# Patient Record
Sex: Female | Born: 1960 | Hispanic: No | Marital: Married | Smoking: Never smoker
Health system: Southern US, Community
[De-identification: ages and names within clinical notes are randomized; demographics above are authoritative.]

## PROBLEM LIST (undated history)

## (undated) DIAGNOSIS — E119 Type 2 diabetes mellitus without complications: Secondary | ICD-10-CM

## (undated) DIAGNOSIS — E785 Hyperlipidemia, unspecified: Secondary | ICD-10-CM

## (undated) DIAGNOSIS — E669 Obesity, unspecified: Secondary | ICD-10-CM

## (undated) DIAGNOSIS — M7500 Adhesive capsulitis of unspecified shoulder: Secondary | ICD-10-CM

## (undated) DIAGNOSIS — M797 Fibromyalgia: Secondary | ICD-10-CM

## (undated) DIAGNOSIS — I8390 Asymptomatic varicose veins of unspecified lower extremity: Secondary | ICD-10-CM

## (undated) DIAGNOSIS — K279 Peptic ulcer, site unspecified, unspecified as acute or chronic, without hemorrhage or perforation: Secondary | ICD-10-CM

## (undated) HISTORY — DX: Fibromyalgia: M79.7

## (undated) HISTORY — DX: Obesity, unspecified: E66.9

## (undated) HISTORY — DX: Asymptomatic varicose veins of unspecified lower extremity: I83.90

## (undated) HISTORY — DX: Type 2 diabetes mellitus without complications: E11.9

## (undated) HISTORY — PX: NECK SURGERY: SHX720

## (undated) HISTORY — DX: Adhesive capsulitis of unspecified shoulder: M75.00

## (undated) HISTORY — DX: Peptic ulcer, site unspecified, unspecified as acute or chronic, without hemorrhage or perforation: K27.9

## (undated) HISTORY — DX: Hyperlipidemia, unspecified: E78.5

---

## 2000-12-24 ENCOUNTER — Encounter: Payer: Self-pay | Admitting: Family Medicine

## 2000-12-24 ENCOUNTER — Ambulatory Visit (HOSPITAL_COMMUNITY): Admission: RE | Admit: 2000-12-24 | Discharge: 2000-12-24 | Payer: Self-pay | Admitting: Family Medicine

## 2001-02-23 ENCOUNTER — Ambulatory Visit (HOSPITAL_COMMUNITY): Admission: RE | Admit: 2001-02-23 | Discharge: 2001-02-24 | Payer: Self-pay | Admitting: Neurosurgery

## 2001-02-23 ENCOUNTER — Encounter: Payer: Self-pay | Admitting: Neurosurgery

## 2001-08-06 ENCOUNTER — Encounter: Admission: RE | Admit: 2001-08-06 | Discharge: 2001-08-06 | Payer: Self-pay | Admitting: Neurosurgery

## 2001-08-06 ENCOUNTER — Encounter: Payer: Self-pay | Admitting: Neurosurgery

## 2002-11-15 ENCOUNTER — Ambulatory Visit (HOSPITAL_COMMUNITY): Admission: RE | Admit: 2002-11-15 | Discharge: 2002-11-15 | Payer: Self-pay | Admitting: Neurology

## 2002-11-15 ENCOUNTER — Encounter: Payer: Self-pay | Admitting: Neurology

## 2002-11-25 ENCOUNTER — Encounter: Admission: RE | Admit: 2002-11-25 | Discharge: 2003-02-23 | Payer: Self-pay | Admitting: Neurology

## 2003-08-30 ENCOUNTER — Ambulatory Visit (HOSPITAL_COMMUNITY): Admission: RE | Admit: 2003-08-30 | Discharge: 2003-08-30 | Payer: Self-pay | Admitting: Neurosurgery

## 2003-09-05 ENCOUNTER — Ambulatory Visit (HOSPITAL_COMMUNITY): Admission: RE | Admit: 2003-09-05 | Discharge: 2003-09-06 | Payer: Self-pay | Admitting: Neurosurgery

## 2003-10-09 ENCOUNTER — Encounter: Admission: RE | Admit: 2003-10-09 | Discharge: 2003-10-09 | Payer: Self-pay | Admitting: Neurological Surgery

## 2003-11-13 ENCOUNTER — Encounter: Admission: RE | Admit: 2003-11-13 | Discharge: 2003-11-13 | Payer: Self-pay | Admitting: Neurosurgery

## 2004-01-16 ENCOUNTER — Ambulatory Visit: Payer: Self-pay | Admitting: Obstetrics and Gynecology

## 2004-04-09 ENCOUNTER — Ambulatory Visit (HOSPITAL_COMMUNITY): Admission: RE | Admit: 2004-04-09 | Discharge: 2004-04-09 | Payer: Self-pay | Admitting: Neurosurgery

## 2004-04-12 ENCOUNTER — Ambulatory Visit: Payer: Self-pay | Admitting: Family Medicine

## 2004-04-22 ENCOUNTER — Ambulatory Visit: Payer: Self-pay | Admitting: Unknown Physician Specialty

## 2004-04-28 ENCOUNTER — Ambulatory Visit: Payer: Self-pay | Admitting: General Surgery

## 2004-05-03 ENCOUNTER — Ambulatory Visit: Payer: Self-pay | Admitting: Unknown Physician Specialty

## 2004-05-05 ENCOUNTER — Ambulatory Visit: Payer: Self-pay | Admitting: General Surgery

## 2005-05-17 ENCOUNTER — Ambulatory Visit: Payer: Self-pay | Admitting: Obstetrics and Gynecology

## 2006-08-14 ENCOUNTER — Ambulatory Visit: Payer: Self-pay | Admitting: Neurosurgery

## 2007-02-19 ENCOUNTER — Ambulatory Visit: Payer: Self-pay | Admitting: General Surgery

## 2007-04-23 ENCOUNTER — Ambulatory Visit: Payer: Self-pay | Admitting: General Surgery

## 2007-05-31 ENCOUNTER — Ambulatory Visit: Payer: Self-pay | Admitting: Neurosurgery

## 2008-01-06 ENCOUNTER — Encounter: Admission: RE | Admit: 2008-01-06 | Discharge: 2008-01-06 | Payer: Self-pay | Admitting: Rheumatology

## 2008-03-01 ENCOUNTER — Encounter: Admission: RE | Admit: 2008-03-01 | Discharge: 2008-03-01 | Payer: Self-pay | Admitting: Rheumatology

## 2008-05-02 ENCOUNTER — Ambulatory Visit: Payer: Self-pay | Admitting: General Surgery

## 2008-05-02 LAB — HM COLONOSCOPY: HM Colonoscopy: NORMAL

## 2008-05-08 ENCOUNTER — Ambulatory Visit (HOSPITAL_COMMUNITY): Admission: RE | Admit: 2008-05-08 | Discharge: 2008-05-08 | Payer: Self-pay | Admitting: Rheumatology

## 2009-11-19 ENCOUNTER — Ambulatory Visit: Payer: Self-pay | Admitting: Surgery

## 2009-12-08 ENCOUNTER — Ambulatory Visit: Payer: Self-pay | Admitting: Vascular Surgery

## 2010-01-05 ENCOUNTER — Ambulatory Visit: Payer: Self-pay | Admitting: Family Medicine

## 2010-03-16 ENCOUNTER — Ambulatory Visit: Admit: 2010-03-16 | Payer: Self-pay | Admitting: Vascular Surgery

## 2010-03-28 ENCOUNTER — Encounter: Payer: Self-pay | Admitting: Rheumatology

## 2010-07-20 NOTE — Consult Note (Signed)
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  She has been  having increasing swelling in the left leg and somewhat in the right leg  over the last several months with aching, throbbing and burning  discomfort which involves mostly the left thigh and calf but also the  right thigh to some degree.  She has no history of thrombophlebitis,  deep vein thrombosis, bleeding ulceration, or other complications but  does have increasing swelling as the day progresses with the left ankle  worse than the right.  She does not wear elastic compression stockings  nor elevate the legs on a regular basis, although when she does elevate  it, it does make the legs feel better.  She takes Celebrex on a daily  basis without relief.  She has never had venous evaluation before.   CHRONIC MEDICAL PROBLEMS:  1. Type 1 diabetes mellitus.  2. Hyperlipidemia.  3. Cervical disk disease, status post surgery.  4. Negative for hypertension, coronary artery disease, COPD, or      stroke.   SOCIAL HISTORY:  She is married.  Works as a Academic librarian.  Does  not use tobacco or alcohol.   FAMILY HISTORY:  Positive for coronary artery disease in mother.  Negative for diabetes and stroke.   REVIEW OF SYSTEMS:  Positive for headaches, arthritis, muscle pain.  Denies any chest pain, dyspnea on exertion, PND, orthopnea, chronic  bronchitis, productive cough, wheezing.  No GI or GU symptoms.  All  other systems are negative for review of systems.   PHYSICAL EXAMINATION:  Blood pressure 114/82, heart rate 75,  respirations 16.  Generally, a well-developed, well-nourished middle-  aged female in no apparent stress.  Alert and oriented x3.  HEENT:  Normal for age.  EOMs intact.   Lungs are clear to auscultation.  No  rhonchi or wheezing.  Cardiovascular:  Regular rhythm.  No murmurs.  Carotid pulses are 3+.  No bruits.  Abdomen:  Obese.  No palpable  masses.  Musculoskeletal:  Free of major deformities.  Neurologic:  Normal.  Skin is free of rashes.  Lower extremity:  There is 3+ femoral  and popliteal, and dorsalis pedis pulses are palpable.  She has 1+ to 2+  edema on the left and 1+ on the right.  Left leg has bulging  varicosities in the great saphenous system beginning in the mid thigh,  extending down to the ankle along the medial calf with some early  hyperpigmentation, 1+ edema, and some scattered reticular veins.  Right  leg has venous insufficiency along the course of the great saphenous  system in the medial thigh and medial calf with some smaller  varicosities in the medial calf area.  No hyperpigmentation.   Today I reviewed the clinical records and also reviewed the venous  duplex exam, which we performed in our lab on September 15th, looking  primarily for deep venous obstruction, and there was no evidence of deep  venous obstruction.   I also did a bedside SonoSite exam today and visualized the left great  saphenous vein at  the saphenofemoral junction, which has gross reflux  extending down to the knee.   I think she does have symptoms coming from valvular incompetence  involving her left great saphenous system with possible early disease on  the right.  We will treat her with long-leg elastic compression  stockings (20 mm -30 mm gradient) as well as elevation and ibuprofen.  She will return in 3 months for further examination.  We will also  obtain a formal venous duplex exam at that time.  If her findings are  corroborated, and she has no improvement, I think we should proceed with  laser ablation of the left great saphenous vein with multiple stab  phlebectomies.  Treatment of the right side will be determined after the  formal  ultrasound.     Quita Skye Hart Rochester, M.James.  Electronically Signed   JDL/MEDQ  James:  12/08/2009  T:  12/09/2009  Job:  [id number]   cc:   Wyline Beady, James.P.M.

## 2010-07-20 NOTE — Procedures (Signed)
DUPLEX DEEP VENOUS EXAM - LOWER EXTREMITY   INDICATION:  Left lower extremity leg pain.   HISTORY:  Edema:  Yes.  Trauma/Surgery:  No.  Pain:  Yes.  PE:  No.  Previous DVT:  No.  Anticoagulants:  No.  Other:   DUPLEX EXAM:                CFV   SFV   PopV  PTV    GSV                R  L  R  L  R  L  R   L  R  L  Thrombosis    o  o     o     o      o     o  Spontaneous   +  +     +     +      +     +  Phasic        +  +     +     +      +     +  Augmentation  +  +     +     +      +     +  Compressible  +  +     +     +      +     +  Competent     o  o     +     +      +     o   Legend:  + - yes  o - no  p - partial  D - decreased   IMPRESSION:  Left lower extremity deep veins appear negative for deep  venous thrombosis.    _____________________________  Quita Skye Hart [place], M.D.   EM/MEDQ  D:  11/19/2009  T:  11/19/2009  Job:  [id number]
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
## 2010-07-23 NOTE — Op Note (Signed)
[place]  Patient:    Carrie James, Carrie James Visit Number: [id number] MRN: [id number]          Service Type: DSU Location: 3000 3040 01 Attending Physician:  Donn Pierini Dictated by:   Julio Sicks, M.D. Proc. 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  PROCEDURE:  C5-6 and C6-7 anterior cervical diskectomy and fusion with allograft and anterior plating.  SURGEON:  Julio Sicks, M.D.  ASSISTANT:  Donalee Citrin, Montez Hageman., M.D.  ANESTHESIA:  General endotracheal.  INDICATION:  Ms. Mcclarty is a 50 year old female with history of neck pain and intermittent left upper extremity symptoms consistent with a left-sided C6 and C7 radiculopathy.  The patient has failed conservative management.  MRI scan demonstrates marked spondylosis at C5-6 and C6-7 with mild to moderate spondylosis at C4-5.  At C5-6 and C6-7 there is moderate spinal stenosis causing spinal cord compression and severe left-sided neural foraminal stenosis.  The patient had been counseled as to her options.  She has decided to proceed with a C5-6 and C6-7 anterior cervical diskectomy and fusion with allograft and anterior plating for hopeful improvement of her symptoms.  We discussed the risks and benefits of surgery, and the patient wishes to proceed.  DESCRIPTION OF PROCEDURE:  The patient was taken to the operating room and placed on the operating table in supine position.  After an adequate level of anesthesia achieved, patient positioned supine with her neck slightly extended and held in place with Holter traction.  The patients anterior cervical region was prepped and draped sterilely.  A 10 blade was used to make a linear skin incision overlying the C6 level.   This was carried down sharply to the platysma.  The platysma was then divided vertically and dissection proceeded along the medial border of the sternocleidomastoid muscle and carotid sheath on the patients right side.  Trachea and esophagus were mobilized and retracted to the left.  Prevertebral fascia was stripped off the anterior spinal column.  Longus colli muscle was then elevated bilaterally using electrocautery.  A deep self-retaining retractor was placed and intraoperative fluoroscopy was used, and the C5-6 and C6-7 levels were identified.  The disk space at both levels was then incised with a 15 blade in rectangular fashion. A wide disk space cleanout was then achieved using pituitary rongeurs, forward and backward-angled Karlin curettes, Kerrison rongeurs, and the high-speed drill.  All elements of the disk were removed at both levels, removed down to the level of the posterior annulus.  The microscope was brought into the field and used throughout the remainder of the diskectomy.  Starting first at C5-6, the remaining aspects of osteophytes and annulus were removed using the high-speed drill down to the level of the posterior longitudinal ligament. The posterior longitudinal ligament was then elevated and resected in a piecemeal fashion using Kerrison rongeurs.  The underlying thecal sac was identified.  A wide central decompression was then performed by undercutting the bodies of C5 and C6 utilizing Kerrison rongeurs.  The underlying spinal canal was then widely decompressed.  Decompression then proceeded out to the left-sided C6 foramen.  The C6 nerve root was identified properly.  All  evidence of spondylitic compression was resected using Kerrison rongeurs, and a wide anterior foraminotomy was performed.  Decompression then proceeded out to the right-sided C6 foramen, and once again the C6 nerve root was identified and a wide anterior foraminotomy was performed.  Attention then  placed at C6-7.  Once again the remaining aspects of osteophytes and annulus were removed using by the high-speed drill down to the level of the posterior longitudinal ligament.  The posterior longitudinal ligament was then elevated and resected in piecemeal fashion using Kerrison rongeurs.  The underlying thecal sac was identified.  A wide central decompression was then performed using Kerrison rongeurs to undercut the bodies of C6 and C7.  Decompression then proceeded out to the left-sided C7 foramen.  The C7 nerve root was identified proximally and a wide anterior foraminotomy was performed using Kerrison rongeurs to fully decompress the nerve root.  Decompression then proceeded out to the right-sided C7 foramen.  Once again the C7 nerve root was identified proximally and a wide anterior foraminotomy was performed using Kerrison rongeurs.  The wound was then copiously irrigated with antibiotic solution.  Gelfoam was placed topically at both levels for hemostasis.  A 6 mm patellar wedge allograft was then impacted into placed and recessed approximately 1 mm from the anterior cortical margin at C5-6 and C6-7.  A 35 mm Atlantis anterior cervical plate was then placed over the C5, C6, and C7 levels.  It was then attached under fluoroscopic guidance using two 13 mm variable-angled screws at C5, C6, and C7.  All six screws were found to be solidly within bone and given a final tightening.  Locking screws were engaged at all three levels.  Retractor was removed.  Hemostasis achieved with bipolar electrocautery.  Final images revealed good position of bone grafts and hardware at the proper operative level, with normal alignment of the spine. The wound was inspected for hemostasis and found to be good.  It was irrigated one final time.  It was then closed in typical fashion.  Steri-Strips and sterile dressing were applied.  There were no apparent complications.  The patient tolerated the  procedure well, and she returns to the recovery room postoperatively. Dictated by:   Julio Sicks, M.D. Attending Physician:  Donn Pierini DD:  02/23/01  TD:  02/26/01 Job: [id number]

## 2013-11-12 ENCOUNTER — Ambulatory Visit: Payer: Self-pay | Admitting: Family Medicine

## 2013-11-12 LAB — HM MAMMOGRAPHY

## 2013-12-09 LAB — HM PAP SMEAR: HM PAP: NEGATIVE

## 2014-05-19 ENCOUNTER — Telehealth: Payer: Self-pay | Admitting: *Deleted

## 2014-05-19 NOTE — Telephone Encounter (Signed)
Patient was calling wanting to know if Dr. Anne HahnWillis could fill out a disability form. Patient was last seen in 2012 which was 4 years ago. Explained to the patient that she would need to get reestablished as a patient before anything can be done. Patient verbalizes understanding. She is aware of a new referral as well.

## 2014-05-26 ENCOUNTER — Encounter: Payer: Self-pay | Admitting: Neurology

## 2014-05-26 ENCOUNTER — Telehealth: Payer: Self-pay | Admitting: *Deleted

## 2014-05-26 ENCOUNTER — Ambulatory Visit (INDEPENDENT_AMBULATORY_CARE_PROVIDER_SITE_OTHER): Payer: Managed Care, Other (non HMO) | Admitting: Neurology

## 2014-05-26 VITALS — BP 120/65 | HR 63 | Ht 62.0 in | Wt 169.6 lb

## 2014-05-26 DIAGNOSIS — R519 Headache, unspecified: Secondary | ICD-10-CM | POA: Insufficient documentation

## 2014-05-26 DIAGNOSIS — M7502 Adhesive capsulitis of left shoulder: Secondary | ICD-10-CM | POA: Diagnosis not present

## 2014-05-26 DIAGNOSIS — M797 Fibromyalgia: Secondary | ICD-10-CM

## 2014-05-26 DIAGNOSIS — G441 Vascular headache, not elsewhere classified: Secondary | ICD-10-CM | POA: Diagnosis not present

## 2014-05-26 DIAGNOSIS — M7500 Adhesive capsulitis of unspecified shoulder: Secondary | ICD-10-CM | POA: Insufficient documentation

## 2014-05-26 DIAGNOSIS — R51 Headache: Secondary | ICD-10-CM

## 2014-05-26 HISTORY — DX: Adhesive capsulitis of unspecified shoulder: M75.00

## 2014-05-26 MED ORDER — MELOXICAM 15 MG PO TABS: 15.0000 mg | ORAL_TABLET | Freq: Every day | ORAL | Status: DC

## 2014-05-26 NOTE — Progress Notes (Signed)
Reason for visit: Left shoulder pain  Referring physician: Dr. Van Clines  Carrie James is a 54 y.o. female  History of present illness:  Carrie James is a 54 year old right-handed [place] female with a history of fibromyalgia. The patient has been seen through this office previously, lasting on 09/01/2010. The patient is on no medications for her fibromyalgia, she is managing the chronic pain with stretching exercises and mild physical activity. The patient will walk about 7 miles a day. She stretches out her neck and shoulders regularly. Within the last month, she has developed a different type of pain in the left shoulder. The pain is associated with restriction of movement of the left arm, she is unable to lift the arm above her head. She has sharp jabbing pains at times. She indicates that she did get a steroid injection in the shoulder which seemed to help the mobility some, but the pain continues. She has had plain x-rays of the shoulder and she was told were unremarkable. The patient did have discomfort across the shoulders, into the back of the head, with occasional cervicogenic headache. The patient has back pain as well. She has a prior history of cervical spine surgery. She does have some occasional numbness and tingling in the ulnar aspect of the left hand, and she had some arthritic pain in the PIP joints of the left hand and saddle joint of the left thumb. She comes to this office for further evaluation.  Past Medical History  Diagnosis Date  . Diabetes mellitus without complication   . Hyperlipidemia   . Fibromyalgia   . Obesity   . 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 Varicose vein of leg   . PUD (peptic ulcer disease)     Past Surgical History  Procedure Laterality Date  . Neck surgery  2002, 2005    fusion    Family History  Problem Relation Age of Onset  . Heart attack Mother   . Healthy Father   . Healthy Sister   . Healthy Brother   . Healthy Brother       Social history:  reports that she has never smoked. She has never used smokeless tobacco. She reports that she does not drink alcohol or use illicit drugs.  Medications:  Prior to Admission medications   Medication Sig Start Date End Date Taking? Authorizing Provider  atorvastatin (LIPITOR) 20 MG tablet Take 20 mg by mouth daily. 01/09/14  Yes Historical Provider, MD  Cholecalciferol 1000 UNITS tablet Take 2,000 Units by mouth daily.   Yes Historical Provider, MD  insulin aspart (NOVOLOG) 100 UNIT/ML injection Inject 15 Units into the skin daily.   Yes Historical Provider, MD  insulin detemir (LEVEMIR) 100 UNIT/ML injection Inject 54 Units into the skin daily.   Yes Historical Provider, MD  sitaGLIPtin (JANUVIA) 25 MG tablet Take 25 mg by mouth daily.   Yes Historical Provider, MD  Vitamin D, Ergocalciferol, (DRISDOL) 50000 UNITS CAPS capsule Take 5,000 Units by mouth once a week. 01/09/14  Yes Historical Provider, MD     No Known Allergies  ROS:  Out of a complete 14 system review of symptoms, the patient complains only of the following symptoms, and all other reviewed systems are negative.  Swelling in the legs Ringing in the ears Joint pain, joint swelling, aching muscles Headache, numbness   Blood pressure 120/65, pulse 63, height  (1.575 m), weight 169 lb 9.6 oz (76.93 kg).  Physical Exam  General: The patient is alert and cooperative at the time of the examination.  Eyes: Pupils are equal, round, and reactive to light. Discs are flat bilaterally.  Neck: The neck is supple, no carotid bruits are noted.  Respiratory: The respiratory examination is clear.  Cardiovascular: The cardiovascular examination reveals a regular rate and rhythm, no obvious murmurs or rubs are noted.  Neuromuscular: The patient has restriction of abduction, internal and external rotation of the left shoulder, not present on the right.  Skin: Extremities are without significant  edema.  Neurologic Exam  Mental status: The patient is alert and oriented x 3 at the time of the examination. The patient has apparent normal recent and remote memory, with an apparently normal attention span and concentration ability.  Cranial nerves: Facial symmetry is present. There is good sensation of the face to pinprick and soft touch bilaterally. The strength of the facial muscles and the muscles to head turning and shoulder shrug are normal bilaterally. Speech is well enunciated, no aphasia or dysarthria is noted. Extraocular movements are full. Visual fields are full. The tongue is midline, and the patient has symmetric elevation of the soft palate. No obvious hearing deficits are noted.  Motor: The motor testing reveals 5 over 5 strength of all 4 extremities. Good symmetric motor tone is noted throughout.  Sensory: Sensory testing is intact to pinprick, soft touch, vibration sensation, and position sense on all 4 extremities. No evidence of extinction is noted.  Coordination: Cerebellar testing reveals good finger-nose-finger and heel-to-shin bilaterally.  Gait and station: Gait is normal. Tandem gait is normal. Romberg is negative. No drift is seen.  Reflexes: Deep tendon reflexes are symmetric and normal bilaterally. Toes are downgoing bilaterally.   Assessment/Plan:  1. Fibromyalgia  2. Frozen shoulder, left  3. Cervicogenic headache  The patient is having discomfort involving the left shoulder with restriction of movement across the left shoulder joint. The patient appears to have partial frozen shoulder on the left. The patient has fibromyalgia pain, and cervicogenic headache. She will be placed on Mobic for at least 6 weeks, taking 15 mg daily with food. The patient will be sent for physical therapy for range of motion therapy of the left shoulder. If this is not helpful, she will be referred to orthopedic surgery for further management of the left shoulder  issues.    Marlan Palau. Keith Ahmaud Duthie MD 05/26/2014 7:37 PM  [facility] [address] [place], [place] [id number]  Phone [phone] Fax [phone]

## 2014-05-26 NOTE — Telephone Encounter (Signed)
Form,[employer] [facility] Dr Anne HahnWillis 05/26/14.

## 2014-05-26 NOTE — Patient Instructions (Signed)
Fibromyalgia Fibromyalgia is a disorder that is often misunderstood. It is associated with muscular pains and tenderness that comes and goes. It is often associated with fatigue and sleep disturbances. Though it tends to be long-lasting, fibromyalgia is not life-threatening. CAUSES  The exact cause of fibromyalgia is unknown. People with certain gene types are predisposed to developing fibromyalgia and other conditions. Certain factors can play a role as triggers, such as:  Spine disorders.  Arthritis.  Severe injury (trauma) and other physical stressors.  Emotional stressors. SYMPTOMS   The main symptom is pain and stiffness in the muscles and joints, which can vary over time.  Sleep and fatigue problems. Other related symptoms may include:  Bowel and bladder problems.  Headaches.  Visual problems.  Problems with odors and noises.  Depression or mood changes.  Painful periods (dysmenorrhea).  Dryness of the skin or eyes. DIAGNOSIS  There are no specific tests for diagnosing fibromyalgia. Patients can be diagnosed accurately from the specific symptoms they have. The diagnosis is made by determining that nothing else is causing the problems. TREATMENT  There is no cure. Management includes medicines and an active, healthy lifestyle. The goal is to enhance physical fitness, decrease pain, and improve sleep. HOME CARE INSTRUCTIONS   Only take over-the-counter or prescription medicines as directed by your caregiver. Sleeping pills, tranquilizers, and pain medicines may make your problems worse.  Low-impact aerobic exercise is very important and advised for treatment. At first, it may seem to make pain worse. Gradually increasing your tolerance will overcome this feeling.  Learning relaxation techniques and how to control stress will help you. Biofeedback, visual imagery, hypnosis, muscle relaxation, yoga, and meditation are all options.  Anti-inflammatory medicines and  physical therapy may provide short-term help.  Acupuncture or massage treatments may help.  Take muscle relaxant medicines as suggested by your caregiver.  Avoid stressful situations.  Plan a healthy lifestyle. This includes your diet, sleep, rest, exercise, and friends.  Find and practice a hobby you enjoy.  Join a fibromyalgia support group for interaction, ideas, and sharing advice. This may be helpful. SEEK MEDICAL CARE IF:  You are not having good results or improvement from your treatment. FOR MORE INFORMATION  National Fibromyalgia Association: www.fmaware.org Arthritis Foundation: www.arthritis.org Document Released: 02/21/2005 Document Revised: 05/16/2011 Document Reviewed: 06/03/2009 ExitCare Patient Information 2015 [employer]. This information is not intended to replace advice given to you by your health care provider. Make sure you discuss any questions you have with your health care provider.  

## 2014-05-26 NOTE — Telephone Encounter (Signed)
Form given to Dr. Anne HahnWillis.

## 2014-05-27 ENCOUNTER — Telehealth: Payer: Self-pay | Admitting: *Deleted

## 2014-05-27 DIAGNOSIS — Z0289 Encounter for other administrative examinations: Secondary | ICD-10-CM

## 2014-05-27 NOTE — Telephone Encounter (Signed)
Form,[employer] [employer] received,completed by Dr Anne HahnWillis and [name] at front desk for patient 05-26-24.

## 2014-05-28 ENCOUNTER — Telehealth: Payer: Self-pay | Admitting: Neurology

## 2014-05-28 NOTE — Telephone Encounter (Signed)
Carrie James with [facility] @ [phone] ext [phone], stated they received faxed [employer]om[employer]ations of Medical Assessment paperwork.  Paperwork was not legible and requesting refax to [phone].  Please call and advise.

## 2014-05-29 NOTE — Telephone Encounter (Signed)
Geoffery SpruceDonna Spencer resent the form.

## 2014-06-09 ENCOUNTER — Telehealth: Payer: Self-pay | Admitting: Neurology

## 2014-06-09 ENCOUNTER — Encounter
Admit: 2014-06-09 | Disposition: A | Discharge status: Home / Self Care | Payer: Self-pay | Attending: Neurology | Admitting: Neurology

## 2014-06-09 NOTE — Telephone Encounter (Signed)
Carrie PrimroseChristina [facility] is calling back as she did receive the fax back that you sent in regard to disability and accomadation for the patient but she actually is having a problem reading doctor's handwritting. Could you please call to assist her?  Thanks!

## 2014-06-09 NOTE — Telephone Encounter (Signed)
Returned Christina's call and left voicemail for her to call me back.

## 2014-06-18 NOTE — Telephone Encounter (Signed)
I returned Carrie James's call and left a voicemail.

## 2014-06-18 NOTE — Telephone Encounter (Signed)
[name] with [employer] @ [phone] x [phone], stated disability paperwork isn't legible.  Please call and advise.

## 2014-06-19 ENCOUNTER — Telehealth: Payer: Self-pay | Admitting: Neurology

## 2014-06-19 NOTE — Telephone Encounter (Signed)
Carrie James is returning your call please call back @ [phone] ext. [phone].

## 2014-06-19 NOTE — Telephone Encounter (Signed)
I called Carrie James back. She could not read the handwriting on the disability form. I told her that Dr. Anne HahnWillis had recommended the patient have a corner desk where everything was in reach of her right arm or very close to her left arm due to her frozen left shoulder. She stated she would call back if she had any more questions.

## 2014-06-19 NOTE — Telephone Encounter (Signed)
I returned Christina's call and left a voicemail. 

## 2014-06-23 ENCOUNTER — Other Ambulatory Visit: Payer: Self-pay | Admitting: Neurology

## 2014-07-07 ENCOUNTER — Telehealth: Payer: Self-pay | Admitting: Neurology

## 2014-07-07 DIAGNOSIS — M542 Cervicalgia: Secondary | ICD-10-CM

## 2014-07-07 NOTE — Telephone Encounter (Signed)
patient says she has been going to physicall therapy and would like to know if you would like to proceed with an MRI.  Please advise patient.

## 2014-07-07 NOTE — Telephone Encounter (Signed)
I called patient. She is getting physical therapy for her left shoulder that is a frozen shoulder issue. Her neck, and shoulder muscles are now hurting, type. The patient is getting headaches from this. We will check MRI evaluation of the cervical spine.

## 2014-07-08 ENCOUNTER — Ambulatory Visit: Payer: Managed Care, Other (non HMO) | Attending: Neurology | Admitting: Physical Therapy

## 2014-07-08 DIAGNOSIS — M6281 Muscle weakness (generalized): Secondary | ICD-10-CM | POA: Diagnosis not present

## 2014-07-08 DIAGNOSIS — M7502 Adhesive capsulitis of left shoulder: Secondary | ICD-10-CM | POA: Insufficient documentation

## 2014-07-08 DIAGNOSIS — M25512 Pain in left shoulder: Secondary | ICD-10-CM | POA: Insufficient documentation

## 2014-07-09 ENCOUNTER — Encounter: Payer: Self-pay | Admitting: Physical Therapy

## 2014-07-09 NOTE — Therapy (Signed)
[facility] [address] [place], [place], [address] Phone: [phone]   Fax:  [phone]  Physical Therapy Treatment  Patient Details  Name: Carrie BumpersHema D James MRN: [id number] Date of Birth: [birth year] Referring Provider:  Lorie PhenixMaloney, Nancy, MD  Encounter Date: 07/08/2014      PT End of Session - 07/08/14 2141    Visit Number 9   Number of Visits 12   Date for PT Re-Evaluation 07/21/14   PT Start Time 1748   PT Stop Time 1855   PT Time Calculation (min) 67 min      Past Medical History  Diagnosis Date  . Diabetes mellitus without complication   . Hyperlipidemia   . Fibromyalgia   . Obesity   . 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 Varicose vein of leg   . PUD (peptic ulcer disease)     Past Surgical History  Procedure Laterality Date  . Neck surgery  2002, 2005    fusion    There were no vitals filed for this visit.  Visit Diagnosis:  Pain in left shoulder  Muscle weakness (generalized)      Subjective Assessment - 07/08/14 1755    Subjective Patient reports her left shoulder is hurting and she is also having neck pain and tinginling into left side of head and face and upper back. She is going to see her MD regarding these symptoms    Patient Stated Goals Patient would like to be able to have less pain in shoulder and be able to use left arm with less stiffness and return to prior level of function    Currently in Pain? Yes   Pain Score 6    Pain Location Shoulder   Pain Orientation Left   Pain Descriptors / Indicators Aching;Tightness;Tender;Spasm   Pain Type Chronic pain   Pain Onset More than a month ago   Pain Frequency Intermittent   Aggravating Factors  using left UE for daily activties, cooking and cleaning house   Pain Relieving Factors rest, heat/ice            [facility] - 07/09/14 0001    Assessment   Medical Diagnosis adhesive capsulitis left shoulder   Onset  Date 03/07/14   Next MD Visit 07/21/2014   Precautions   Precautions None   Balance Screen   Has the patient fallen in the past 6 months No   Has the patient had a decrease in activity level because of a fear of falling?  No   Is the patient reluctant to leave their home because of a fear of falling?  No             OBJECTIVE: AROM: left shoulder forward elevation: 0-120 with pain limiting further motion, ER 40 degrees Palpation: + spasms upper trapezius left shoulder, + tenderness posterior aspect left shoulder Strength: deferred due to pain Treatment:  1. Sitting: US pulsed @ 50% 1 MHz 1.4 w/cm2 to left shoulder/upper trapezius to decreased pain and spasms 2. Electrical stimulation to left shoulder girdle for pain control and muscle re education: Guern[place]ian stim. applied to medial border of scapula for Rhomboids and lower trapezius muscles 10/10 cycle to muscle contraction as tolerated, High volt estim. (2) electrodes applied to anterior and posterior aspect of left shoulder 3. Re assessed exercises for patient to perform at home: scapular adduction with control with/without resistive band, precautions to avoid strain on left shoulder  Pt response for medical necessity:Patient tolerated estim. with improved contraction and control of scapula following treatment,Patient able to raise left arm to above shoulder level with less difficulty and pain level decreased from 6/10 to 4/10                    PT Education - 07/08/14 2141    Education provided Yes   Education Details pain control and exercise   Person(s) Educated Patient   Methods Explanation;Demonstration   Comprehension Verbalized understanding;Returned demonstration             PT Long Term Goals - 07/08/14 2145    PT LONG TERM GOAL #1   Title pt. will improve quickDASH to 45% to demonstrate improvement with self perceived disability with ADL's   Time 3   Period Weeks   Status New   PT LONG TERM  GOAL #2   Title pt. will report decreased pain level to max 5/10 with aggravating activities    Time 3   Period Weeks   Status New   PT LONG TERM GOAL #3   Title pt. will be independent with home program without cuing for pain control, exercise for flexibility, strength to improve ADL function    Time 6   Period Weeks   Status New   PT LONG TERM GOAL #4   Title pt. will improve quickDASH to 30% or less to demonstrate improvement with ADL's with UE for daily activities at home and work   Time 6   Period Weeks   Status New   PT LONG TERM GOAL #5   Title Pt. will have as close to full AROM shoulder to allow improved function with overhead activities    Time 6   Period Weeks   Status New               Plan - 07/09/14 2143    Clinical Impression Statement Patient is a right hand dominant female who presents with findings consistent with inflammation and limited ROM and strength in Left shoulder/UE. She is limited in functional use of left UE for lifting, carrying and pushing and overhead activities. She has limited knowledge of appropriate pain control strategies and exercise progression to achieve full ROM and function with left UE and will benefit from physical therapy intervention to address limitations.   Pt will benefit from skilled therapeutic intervention in order to improve on the following deficits Pain;Increased muscle spasms;Decreased strength;Impaired flexibility   Rehab Potential Good   PT Frequency 2x / week   PT Duration 6 weeks   PT Treatment/Interventions Ultrasound;Patient/family education;Passive range of motion;Cryotherapy;Electrical Stimulation;Therapeutic exercise;Manual techniques;Moist Heat   PT Next Visit Plan pain control, ther. ex. for improving flexiblity and strength, manual therapy to improve ROM, decrease pain   Consulted and Agree with Plan of Care Patient        Problem List Patient Active Problem List   Diagnosis Date Noted  . Fibromyalgia  05/26/2014  . Headache 05/26/2014  . 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 [place], [place], [address] Phone: [phone]   Fax:  [phone]

## 2014-07-10 ENCOUNTER — Encounter: Payer: Self-pay | Admitting: Physical Therapy

## 2014-07-17 ENCOUNTER — Encounter: Payer: Self-pay | Admitting: Physical Therapy

## 2014-07-17 ENCOUNTER — Ambulatory Visit: Payer: Managed Care, Other (non HMO) | Attending: Neurology | Admitting: Physical Therapy

## 2014-07-17 DIAGNOSIS — M6281 Muscle weakness (generalized): Secondary | ICD-10-CM

## 2014-07-17 DIAGNOSIS — M7502 Adhesive capsulitis of left shoulder: Secondary | ICD-10-CM | POA: Insufficient documentation

## 2014-07-17 DIAGNOSIS — M25512 Pain in left shoulder: Secondary | ICD-10-CM

## 2014-07-18 NOTE — Therapy (Signed)
[facility] [address] [place], [place], [address] Phone: [phone]   Fax:  [phone]  Physical Therapy Treatment  Patient Details  Name: Carrie BumpersHema D James MRN: [id number] Date of Birth: 1960-10-25 Referring Provider:  Lorie PhenixMaloney, Nancy, MD  Encounter Date: 07/17/2014      PT End of Session - 07/17/14 1845    Visit Number 10   Number of Visits 12   Date for PT Re-Evaluation 07/21/14   PT Start Time 1810   PT Stop Time 1845   PT Time Calculation (min) 35 min   Activity Tolerance Patient tolerated treatment well   Behavior During Therapy [facility] for tasks assessed/performed      Past Medical History  Diagnosis Date  . Diabetes mellitus without complication   . Hyperlipidemia   . Fibromyalgia   . Obesity   . 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 Varicose vein of leg   . PUD (peptic ulcer disease)     Past Surgical History  Procedure Laterality Date  . Neck surgery  2002, 2005    fusion    There were no vitals filed for this visit.  Visit Diagnosis:  Pain in left shoulder  Muscle weakness (generalized)      Subjective Assessment - 07/17/14 1830    Subjective Patient reports her left shoulder is hurting much less and she is able to raise it to shoulder level and above with less pain. She is pleased with her progress over the past week. She is also having no neck pain and only mild tinginling into left side of head and face and upper back. She feels she is improving well with current treatment.    Patient Stated Goals Patient would like to be able to have less pain in shoulder and be able to use left arm with less stiffness and return to prior level of function    Currently in Pain? Yes   Pain Score 3    Pain Location Shoulder   Pain Orientation Left   Pain Descriptors / Indicators Aching;Tender;Tightness   Pain Type Chronic pain   Pain Frequency Intermittent   Aggravating Factors  using left  arm for daily chores and cooking at home   Pain Relieving Factors rest, ice, heat        OBJECTIVE: AROM: left shoulder forward elevation: 0-115 with pain limiting further motion, ER 40 degrees Palpation: + spasms upper trapezius left shoulder, + tenderness posterior aspect left shoulder and anterior aspect of left shoulder + impingement with crossing left arm across to opposite shoulder with increased anterior shoulder pain  Treatment:  1. Sitting: 10 min.:  US pulsed @ 50% 1 MHz 1.4 w/cm2 to left shoulder anterior and posterior aspect/upper trapezius to decrease pain and spasms, improved ability to cross arm to opposite shoulder with only mild tenderness following treatment 2. Electrical stimulation to left shoulder girdle for pain control and muscle re education: [place]ian stim. applied to medial border of scapula for Rhomboids and lower trapezius muscles 10/10 cycle to muscle contraction as tolerated, High volt estim. (2) electrodes applied to anterior and posterior aspect of left shoulder with patient seated and ice applied to same x 20 min.  3. Re assessed exercises for patient to perform at home: scapular adduction with control with/without resistive band, precautions to avoid strain on left shoulder   Pt response for medical necessity:Patient tolerated estim. with improved contraction and control of scapula following treatment,Patient able  to raise left arm to above shoulder level with less difficulty and pain level decreased from 3/10 to mild soreness, still unable to raise arm >120 degrees due to increased pain         PT Education - 07/17/14 1831    Education provided Yes   Education Details Pain control, continue with ROM exercises within pain free range   Person(s) Educated Patient   Methods Explanation   Comprehension Verbalized understanding             PT Long Term Goals - 07/08/14 2145    PT LONG TERM GOAL #1   Title pt. will improve quickDASH to 45% to  demonstrate improvement with self perceived disability with ADL's   Time 3   Period Weeks   Status New   PT LONG TERM GOAL #2   Title pt. will report decreased pain level to max 5/10 with aggravating activities    Time 3   Period Weeks   Status New   PT LONG TERM GOAL #3   Title pt. will be independent with home program without cuing for pain control, exercise for flexibility, strength to improve ADL function    Time 6   Period Weeks   Status New   PT LONG TERM GOAL #4   Title pt. will improve quickDASH to 30% or less to demonstrate improvement with ADL's with UE for daily activities at home and work   Time 6   Period Weeks   Status New   PT LONG TERM GOAL #5   Title Pt. will have as close to full AROM shoulder to allow improved function with overhead activities    Time 6   Period Weeks   Status New               Plan - 07/17/14 1845    Clinical Impression Statement Patient is progressing with decreased pain in left shoulder which allows her to exercise with less difficulty and raise left shoulder to just above shoulder level with less difficutly. She continues with limitaitons of pain, stiffness and weakness in left UE and is progressing slowly due to chronic condition. she will benefit from continued physical therapy intervention to address limitaiotns and imrpove functional use of left UE.    Pt will benefit from skilled therapeutic intervention in order to improve on the following deficits Pain;Increased muscle spasms;Decreased strength;Impaired flexibility   Rehab Potential Good   PT Frequency 2x / week   PT Duration 6 weeks   PT Treatment/Interventions Ultrasound;Patient/family education;Passive range of motion;Cryotherapy;Electrical Stimulation;Therapeutic exercise;Manual techniques;Moist Heat   PT Next Visit Plan pain control, ther. ex. for improving flexiblity and strength, manual therapy to improve ROM, decrease pain        Problem List Patient Active Problem  List   Diagnosis Date Noted  . Fibromyalgia 05/26/2014  . Headache 05/26/2014  . 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 [address] [place], [place], [address] Phone: [phone]   Fax:  [phone]

## 2014-07-22 ENCOUNTER — Ambulatory Visit: Payer: Managed Care, Other (non HMO) | Admitting: Physical Therapy

## 2014-07-24 ENCOUNTER — Encounter: Payer: Self-pay | Admitting: Physical Therapy

## 2014-07-24 ENCOUNTER — Ambulatory Visit: Payer: Managed Care, Other (non HMO) | Admitting: Physical Therapy

## 2014-07-24 DIAGNOSIS — M7502 Adhesive capsulitis of left shoulder: Secondary | ICD-10-CM | POA: Diagnosis not present

## 2014-07-24 DIAGNOSIS — M6281 Muscle weakness (generalized): Secondary | ICD-10-CM

## 2014-07-24 DIAGNOSIS — M25512 Pain in left shoulder: Secondary | ICD-10-CM

## 2014-07-25 NOTE — Therapy (Signed)
[facility] [address], [place], [address] Phone: [phone]   Fax:  [phone]  Physical Therapy Treatment  Patient Details  Name: Carrie James MRN: [id number] Date of Birth: 11/01/1960 Referring Provider:  Kathrynn Ducking, MD  Encounter Date: 07/24/2014      PT End of Session - 07/24/14 1845    Visit Number 11   Number of Visits 20   Date for PT Re-Evaluation 08/21/14   PT Start Time 3354   PT Stop Time 1840   PT Time Calculation (min) 60 min   Activity Tolerance Patient tolerated treatment well   Behavior During Therapy [facility] for tasks assessed/performed      Past Medical History  Diagnosis Date  . Diabetes mellitus without complication   . Hyperlipidemia   . Fibromyalgia   . Obesity   . 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 Varicose vein of leg   . PUD (peptic ulcer disease)     Past Surgical History  Procedure Laterality Date  . Neck surgery  2002, 2005    fusion    There were no vitals filed for this visit.  Visit Diagnosis:  Pain in left shoulder - Plan: PT plan of care cert/re-cert  Muscle weakness (generalized) - Plan: PT plan of care cert/re-cert      Subjective Assessment - 07/24/14 1750    Subjective Patient reports her left shoulder is hurting much less and she is able to raise it to shoulder level and above with less pain. She is also having no neck pain and only mild tinginling into left side of head and face and upper back. She reports that she is noticing that she is using her left arm more automatically for daily tasks with less difficulty and pain. She continues with stiffness in left shoulder.    Patient Stated Goals Patient would like to be able to have less pain in shoulder and be able to use left arm with less stiffness and return to prior level of function    Currently in Pain? Yes   Pain Score 2    Pain Location Shoulder   Pain Orientation Left   Pain Descriptors / Indicators Aching;Tightness;Tender   Pain Type Chronic pain   Pain Onset More than a month ago   Pain Frequency Intermittent   Aggravating Factors  sudden movements, crossing arm in front of body   Pain Relieving Factors rest, ice, heat   Multiple Pain Sites No      Objective: Quick Dash: 48% (initially was 57% impairment), work module 30% (initially was 50% impairment) AROM left shoulder: 0-115/120 degrees with pain at end range of motion: initially had pain throughout motion Palpation: + tenderness and point spasms in posterior aspect of left UE triceps, lats., + increased thickness in soft tissue anterior aspect of left shoulder and + spasms palpable in upper trapezius   Treatment: Manual therapy: soft tissue mobilization: left UE: posterior aspect of upper arm along triceps, anterior aspect of left shoulder, latissimus release performed,  joint mobilization left shoulder with patient seated in chair: GHJ lateral distraction grade 2-3 x 10 reps, inferior glide grade 2-3, 2 sets of 10, A/P glides performed grade 2-3 x 10 reps  Ultrasound: 3 MHZ static 20% pulsed @ 0.8w/cm2 to posterior aspect left upper arm over trigger point/muscle spasm x 3 min. Therapeutic exercise: Performed AAROM in sitting left shoulder 3 sets of 5 reps with overpressure  at end range for forward elevation, instructed in use of pulleys with back to pulley with verbal cues to perform end range stretches for forward elevation with 5 second holds without hiking left shoulder, instructed in performance of pendulum exercise with demonstration and patient performed with verbal cuing following demonstration, tactile and verbal required for scapular adduction with correct activation of lower trapezius, 5 reps  High volt estim applied to left shoulder anterior/posterior aspect, upper trapezius and triceps with patient seated,  Continuous mode with intensity to tolerance for reduction of muscle spasms and pain x  20 min. With moist heat applied to same   Patient response to treatment: Patient reported decreased pain with manual therapy treatment, improved soft tissue elasticity and able to raise left UE to 120 degrees with less difficulty and pain at end range only. She required verbal cues and tactile cues to perform exercises with proper shoulder alignment and with repetition improved technique       PT Education - 07/24/14 1815    Education provided Yes   Education Details Instructed patient in home program for use of pulleys, pendulum exercise, scapular adduction, AAROM left shoulder in supine lying   Person(s) Educated Patient   Methods Explanation;Verbal cues   Comprehension Verbalized understanding;Returned demonstration;Verbal cues required          PT Long Term Goals - 07/24/14 1940    PT LONG TERM GOAL #1   Title pt. will improve quickDASH to 45% to demonstrate improvement with self perceived disability with ADL's   Time 3   Period Weeks   Status Partially Met   PT LONG TERM GOAL #2   Title pt. will report decreased pain level to max 5/10 with aggravating activities    Time 3   Period Weeks   Status Achieved   PT LONG TERM GOAL #3   Title pt. will be independent with home program without cuing for pain control, exercise for flexibility, strength to improve ADL function    Time 6   Period Weeks   Status Partially Met   PT LONG TERM GOAL #4   Title pt. will improve quickDASH to 30% or less to demonstrate improvement with ADL's with UE for daily activities at home and work   Time 4   Period Weeks   Status Revised   PT LONG TERM GOAL #5   Title Pt. will have as close to full AROM shoulder to allow improved function with overhead activities    Time 4   Period Weeks   Status Revised          Plan - 07/24/14 1900    Clinical Impression Statement Patient is progressing slowly with goals due to chronic condition. She is having less pain in left shoulder and has primary  complaint of stiffness in shoulder and tenderness along posterior aspect of left shoulder. She continues with limited knowledge of posture awareness and appropriate progression of exercise to improve ROM and strength and will benefit from additional physical therapy intervention to achieve full pain free ROM and be independett with home program for self managemnt of pain and exercises.    Pt will benefit from skilled therapeutic intervention in order to improve on the following deficits Pain;Increased muscle spasms;Decreased strength;Impaired flexibility   Rehab Potential Good   PT Frequency 2x / week   PT Duration 4 weeks   PT Treatment/Interventions Ultrasound;Patient/family education;Passive range of motion;Cryotherapy;Electrical Stimulation;Therapeutic exercise;Manual techniques;Moist Heat, iontophoresis with dexamethasone 85m/ml   PT Next Visit Plan pain  control, ther. ex. for improving flexiblity and strength, manual therapy to improve ROM, decrease pain   Consulted and Agree with Plan of Care Patient        Problem List Patient Active Problem List   Diagnosis Date Noted  . Fibromyalgia 05/26/2014  . Headache 05/26/2014  . 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, [place], [address] Phone: [phone]   Fax:  [phone]

## 2014-07-29 ENCOUNTER — Ambulatory Visit: Payer: Managed Care, Other (non HMO) | Admitting: Physical Therapy

## 2014-07-31 ENCOUNTER — Ambulatory Visit: Payer: Managed Care, Other (non HMO) | Admitting: Physical Therapy

## 2014-08-06 ENCOUNTER — Ambulatory Visit: Payer: Managed Care, Other (non HMO) | Attending: Neurology | Admitting: Physical Therapy

## 2014-08-06 ENCOUNTER — Encounter: Payer: Self-pay | Admitting: Physical Therapy

## 2014-08-06 DIAGNOSIS — M6281 Muscle weakness (generalized): Secondary | ICD-10-CM | POA: Diagnosis present

## 2014-08-06 DIAGNOSIS — M25512 Pain in left shoulder: Secondary | ICD-10-CM | POA: Insufficient documentation

## 2014-08-07 NOTE — Therapy (Signed)
[facility] [address] [place], [address] Phone: [phone]   Fax:  [phone]  Physical Therapy Treatment  Patient Details  Name: Carrie James MRN: [id number] Date of Birth: Jan 15, 1961 Referring Provider:  Kathrynn Ducking, MD  Encounter Date: 08/06/2014      PT End of Session - 08/06/14 1945    Visit Number 12   Number of Visits 20   Date for PT Re-Evaluation 08/21/14   PT Start Time 1750   PT Stop Time 1850   PT Time Calculation (min) 60 min      Past Medical History  Diagnosis Date  . Diabetes mellitus without complication   . Hyperlipidemia   . Fibromyalgia   . Obesity   . 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 Varicose vein of leg   . PUD (peptic ulcer disease)     Past Surgical History  Procedure Laterality Date  . Neck surgery  2002, 2005    fusion    There were no vitals filed for this visit.  Visit Diagnosis:  Pain in left shoulder  Muscle weakness (generalized)      Subjective Assessment - 08/06/14 1755    Subjective Patient reports she is feeling better and still cannot put arm behind her head or back (rotations).    Patient Stated Goals Patient would like to be able to have less pain in shoulder and be able to use left arm with less stiffness and return to prior level of function    Currently in Pain? Yes   Pain Score 3    Pain Location Shoulder   Pain Orientation Left   Pain Descriptors / Indicators Aching;Sharp   Pain Type Chronic pain   Pain Onset More than a month ago   Pain Frequency Intermittent   Aggravating Factors  sudden movements make pain go up to a 7/10   Pain Relieving Factors rest, ice, heat   Multiple Pain Sites No       Objective:  AROM: left shoulder forward elevation 120 degrees in sitting AAROM: left shoulder forward elevation in supine lying 125 degrees, ER 50, IR 50 Palpation: + tenderness anterior and posterior aspect of left  shoulder, + spasms in upper trapezius, lats and pectoral muscles, decrease scapular mobility  Treatment: Manual therapy techniques: joint mobilization: GHJ left shoulder: lateral distraction grade 2-3 with inferior and anterior glides x 3 sets and scapulo thoracic joint mobilization lateral distraction and upward/downard rotations, STM left shoulder posterior shoulder, upper trapezius release, pectoral and latissimus soft tissue mobilization Exercise: AAROM all planes of motion left shoulder and  rhythmic stabilization with patient supine lying sets of 10, verbal and tactile cuing required to perform all exercises High volt estim applied to left shoulder: upper trapezius, medial border of scapula, anterior/posterior aspect of left shoulder   Patient response to treatment: improved soft tissue elasticity, decreased soreness and able to raise left UE to 125 degrees forward elevation  in sitting            PT Education - 08/06/14 1850    Education provided Yes   Education Details Re assessed home exercises with verbal cuing   Person(s) Educated Patient   Methods Explanation   Comprehension Verbalized understanding             PT Long Term Goals - 07/24/14 1940    PT LONG TERM GOAL #1   Title pt. will improve quickDASH  to 45% to demonstrate improvement with self perceived disability with ADL's   Time 3   Period Weeks   Status Partially Met   PT LONG TERM GOAL #2   Title pt. will report decreased pain level to max 5/10 with aggravating activities    Time 3   Period Weeks   Status Achieved   PT LONG TERM GOAL #3   Title pt. will be independent with home program without cuing for pain control, exercise for flexibility, strength to improve ADL function    Time 6   Period Weeks   Status Partially Met   PT LONG TERM GOAL #4   Title pt. will improve quickDASH to 30% or less to demonstrate improvement with ADL's with UE for daily activities at home and work   Time 4   Period  Weeks   Status Revised   PT LONG TERM GOAL #5   Title Pt. will have as close to full AROM shoulder to allow improved function with overhead activities    Time 4   Period Weeks   Status Revised               Plan - 08/06/14 1945    Clinical Impression Statement Patient  continues to demonstrate improvement with ROM and strength in left UE with current physical therapy intervention. she is progressing slowly due to chronic condition.    Pt will benefit from skilled therapeutic intervention in order to improve on the following deficits Pain;Increased muscle spasms;Decreased strength;Impaired flexibility   Rehab Potential Good   PT Frequency 2x / week   PT Duration 4 weeks   PT Treatment/Interventions Ultrasound;Patient/family education;Passive range of motion;Cryotherapy;Electrical Stimulation;Therapeutic exercise;Manual techniques;Moist Heat   PT Next Visit Plan pain control, ther. ex. for improving flexiblity and strength, manual therapy to improve ROM, decrease pain        Problem List Patient Active Problem List   Diagnosis Date Noted  . Fibromyalgia 05/26/2014  . Headache 05/26/2014  . 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 [place], [address] Phone: [phone]   Fax:  [phone]

## 2014-08-12 ENCOUNTER — Encounter: Payer: Self-pay | Admitting: Physical Therapy

## 2014-08-12 ENCOUNTER — Ambulatory Visit: Payer: Managed Care, Other (non HMO) | Admitting: Physical Therapy

## 2014-08-12 DIAGNOSIS — M25512 Pain in left shoulder: Secondary | ICD-10-CM

## 2014-08-12 DIAGNOSIS — M6281 Muscle weakness (generalized): Secondary | ICD-10-CM

## 2014-08-12 NOTE — Therapy (Signed)
[facility] [address], [place], [address] Phone: [phone]   Fax:  [phone]  Physical Therapy Treatment  Patient Details  Name: TANISHIA LEMASTER MRN: [id number] Date of Birth: [birth year] Referring Provider:  Kathrynn Ducking, MD  Encounter Date: 08/12/2014      PT End of Session - 08/12/14 1809    Visit Number 13   Number of Visits 20   Date for PT Re-Evaluation 08/21/14   PT Start Time 6945   PT Stop Time 1820   PT Time Calculation (min) 40 min   Activity Tolerance Patient tolerated treatment well   Behavior During Therapy [facility] for tasks assessed/performed      Past Medical History  Diagnosis Date  . Diabetes mellitus without complication   . Hyperlipidemia   . Fibromyalgia   . Obesity   . 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 Varicose vein of leg   . PUD (peptic ulcer disease)     Past Surgical History  Procedure Laterality Date  . Neck surgery  2002, 2005    fusion    There were no vitals filed for this visit.  Visit Diagnosis:  Pain in left shoulder  Muscle weakness (generalized)      Subjective Assessment - 08/12/14 1740    Subjective Patient reports she is feeling better and still cannot put arm behind her head or back (rotations).    Patient Stated Goals Patient would like to be able to have less pain in shoulder and be able to use left arm with less stiffness and return to prior level of function    Currently in Pain? Yes   Pain Score 3    Pain Location Shoulder   Pain Orientation Left   Pain Descriptors / Indicators Aching   Pain Type Chronic pain   Pain Onset More than a month ago   Pain Frequency Intermittent   Multiple Pain Sites No                         [facility] Adult PT Treatment/Exercise - 08/12/14 1741    Exercises   Exercises Other Exercises   Other Exercises  with patient supine lying on table: AAROM left shoulder, in conjunction  with STM and joint mobilization, rhythmic stabilization with right Ue at 90 degrees 2 sets of 10    Modalities   Modalities Electrical Stimulation;Moist Heat   Moist Heat Therapy   Number Minutes Moist Heat 20 Minutes   Moist Heat Location Shoulder   Electrical Stimulation   Electrical Stimulation Location left shoulder   Electrical Stimulation Parameters high volt estim. to anterior and posterior aspect of left shoulder and Russian stim. applied 10/10 cycle to posterior aspect of shoulder along medial border of scapula and lower trapezius muscle with patient seated in chair   Electrical Stimulation Goals Neuromuscular facilitation;Pain   Manual Therapy   Manual Therapy Joint mobilization;Soft tissue mobilization   Manual therapy comments decreased capsular mobility inferriorly and anteriorly and posteriorly    Joint Mobilization GHJ mobilization grade 2-3 with oscillations end range, 3-5 sets with left arm at side with patient supine on treatment table   Soft tissue mobilization STM anterior aspect of left shoulder with patient supine lying anterior aspect of shoulder/ posterior aspect of shoulder     response to treatment: decreased stiffness and improved AROM from 120 to 125/130 active assistive and verbalized understanding of  correct stretching technique with decreased strain on left shoulder           PT Education - 08/12/14 1808    Education provided Yes   Education Details Instructed patient not to go beyond a gentle sttretch for increasing ROM in left shoulder and to be sure to do self mobilization inferior glide to gain more pain free ROM   Person(s) Educated Patient   Methods Explanation;Demonstration;Verbal cues   Comprehension Verbalized understanding;Returned demonstration;Verbal cues required             PT Long Term Goals - 07/24/14 1940    PT LONG TERM GOAL #1   Title pt. will improve quickDASH to 45% to demonstrate improvement with self perceived disability  with ADL's   Time 3   Period Weeks   Status Partially Met   PT LONG TERM GOAL #2   Title pt. will report decreased pain level to max 5/10 with aggravating activities    Time 3   Period Weeks   Status Achieved   PT LONG TERM GOAL #3   Title pt. will be independent with home program without cuing for pain control, exercise for flexibility, strength to improve ADL function    Time 6   Period Weeks   Status Partially Met   PT LONG TERM GOAL #4   Title pt. will improve quickDASH to 30% or less to demonstrate improvement with ADL's with UE for daily activities at home and work   Time 4   Period Weeks   Status Revised   PT LONG TERM GOAL #5   Title Pt. will have as close to full AROM shoulder to allow improved function with overhead activities    Time 4   Period Weeks   Status Revised               Plan - 08/12/14 1810    Clinical Impression Statement Patient demonstrated improved ROM from 115/120 to 125 with less stiffness and pain following treatment today. She continues with increased stiffness at end of work day and will monitor this and work on decreasing strain with exercises.    Pt will benefit from skilled therapeutic intervention in order to improve on the following deficits Pain;Increased muscle spasms;Decreased strength;Impaired flexibility   Rehab Potential Good   PT Frequency 2x / week   PT Duration 4 weeks   PT Treatment/Interventions Ultrasound;Patient/family education;Passive range of motion;Cryotherapy;Electrical Stimulation;Therapeutic exercise;Manual techniques;Moist Heat   PT Next Visit Plan pain control, ther. ex. for improving flexiblity and strength, manual therapy to improve ROM, decrease pain   Consulted and Agree with Plan of Care Patient        Problem List Patient Active Problem List   Diagnosis Date Noted  . Fibromyalgia 05/26/2014  . Headache 05/26/2014  . Frozen shoulder syndrome 05/26/2014    Jomarie Longs PT 08/12/2014, 10:52 PM  [facility] [address], [place], [address] Phone: [phone]   Fax:  [phone]

## 2014-08-19 ENCOUNTER — Encounter: Payer: Self-pay | Admitting: Physical Therapy

## 2014-08-19 ENCOUNTER — Ambulatory Visit: Payer: Managed Care, Other (non HMO) | Admitting: Physical Therapy

## 2014-08-19 DIAGNOSIS — M25512 Pain in left shoulder: Secondary | ICD-10-CM | POA: Diagnosis not present

## 2014-08-19 DIAGNOSIS — M6281 Muscle weakness (generalized): Secondary | ICD-10-CM

## 2014-08-20 NOTE — Therapy (Signed)
[facility] [address], [place], [address] Phone: [phone]   Fax:  [phone]  Physical Therapy Treatment  Patient Details  Name: Carrie James MRN: [id number] Date of Birth: 12/13/1960 Referring Provider:  Kathrynn Ducking, MD  Encounter Date: 08/19/2014      PT End of Session - 08/19/14 1746    Visit Number 14   Number of Visits 20   Date for PT Re-Evaluation 08/21/14   PT Start Time 7014   PT Stop Time 1835   PT Time Calculation (min) 55 min   Activity Tolerance Patient tolerated treatment well   Behavior During Therapy [facility] for tasks assessed/performed      Past Medical History  Diagnosis Date  . Diabetes mellitus without complication   . Hyperlipidemia   . Fibromyalgia   . Obesity   . 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 Varicose vein of leg   . PUD (peptic ulcer disease)     Past Surgical History  Procedure Laterality Date  . Neck surgery  2002, 2005    fusion    There were no vitals filed for this visit.  Visit Diagnosis:  Pain in left shoulder  Muscle weakness (generalized)      Subjective Assessment - 08/19/14 1744    Subjective Patient reports she is feeling better and still cannot put arm behind her head or back (rotations). She reports having much less pain in shoulder and is working hard to get more motion even if it hurts.    Patient Stated Goals Patient would like to be able to have less pain in shoulder and be able to use left arm with less stiffness and return to prior level of function    Currently in Pain? Yes   Pain Score 3    Pain Location Shoulder   Pain Orientation Left   Pain Descriptors / Indicators Aching   Pain Type Chronic pain   Pain Onset More than a month ago   Pain Frequency Intermittent   Aggravating Factors  sudden movements and going behind back makes increased pain in shoulder   Multiple Pain Sites No     Objective: AROM: seated  forward elevation left shoulder 0-110, IR behind back to side of hip, ER limited 50% AAROM: supine lying left shoulder forward elevation to 125, IR and ER to 30 degrees with pain/spasms limiting further motion Joint mobility left shoulder: GHJ decreased mobility inferior, anterior and posterior glides        [facility] Adult PT Treatment/Exercise - 08/19/14 1752    Exercises   Exercises Other Exercises   Other Exercises  with patient supine lying on table: AAROM left shoulder, in conjunction with STM and joint mobilization, rhythmic stabilization with right UE at 90 degrees 2 sets of 10, shoulder stretch for pectoral muscle with arm at 45 degrees in scapular plane, instructed in corner stretch and posterior capsule stretch with patient performing with verbal cuing and demonstration, scapular adduction and self stretching at wall performed with verbal cuing/demonstration    Modalities   Modalities Electrical Stimulation;Moist Heat   Moist Heat Therapy   Moist Heat Location Shoulder: left x 20 min. In conjunction with estim. Patient seated in Physiological scientist Location left shoulder: High volt estim. Anterior and posterior aspect continuous mode, intensity to tolerance and russian stim. To posterior aspect of shoulder along medial border of scapula and lower  trapezius, 10/10 cycle, intensity to contraction/tolerance x 20 min.    Electrical Stimulation Goals Neuromuscular facilitation;Pain   Manual Therapy   Manual Therapy Joint mobilization;Soft tissue mobilization   Manual therapy comments decreased capsular mobility inferriorly and anteriorly and posteriorly    Joint Mobilization GHJ mobilization grade 2-3 with oscillations end range, 3-5 sets with left arm at side with patient supine on treatment table   Soft tissue mobilization STM anterior aspect of left shoulder with patient supine lying anterior aspect of shoulder/ posterior aspect of shoulder       Patient response to treatment: decreased pain with ROM stretches at end ranges with patient performing scapular adduction x 5-10 reps prior to stretching and with GHJ mobilizations, Improved AAROM left shoulder form 110 to 130 degrees forward elevation, continues with decreased ER: 30 degrees and IR 30 degrees with pain end ranges           PT Education - 08/19/14 1830    Education provided Yes   Education Details Reassessed home program for stretching left shoulder and performing scapular control exercises to decrease pain in left shoulder   Person(s) Educated Patient   Methods Explanation;Verbal cues;Tactile cues   Comprehension Verbalized understanding;Returned demonstration;Verbal cues required;Tactile cues required             PT Long Term Goals - 07/24/14 1940    PT LONG TERM GOAL #1   Title pt. will improve quickDASH to 45% to demonstrate improvement with self perceived disability with ADL's   Time 3   Period Weeks   Status Partially Met   PT LONG TERM GOAL #2   Title pt. will report decreased pain level to max 5/10 with aggravating activities    Time 3   Period Weeks   Status Achieved   PT LONG TERM GOAL #3   Title pt. will be independent with home program without cuing for pain control, exercise for flexibility, strength to improve ADL function    Time 6   Period Weeks   Status Partially Met   PT LONG TERM GOAL #4   Title pt. will improve quickDASH to 30% or less to demonstrate improvement with ADL's with UE for daily activities at home and work   Time 4   Period Weeks   Status Revised   PT LONG TERM GOAL #5   Title Pt. will have as close to full AROM shoulder to allow improved function with overhead activities    Time 4   Period Weeks   Status Revised               Plan - 08/19/14 1838    Clinical Impression Statement Patient demonstrates improved ROM to 130 degrees forward elevation following treatment. She continues with limitations of pain and  decresaed ROM in left shoulder that interferes with normal activity with daily chores/personal care.  She is gaining knowledge of home exercises and self managment skills for pain control in order to improve function at home.    Pt will benefit from skilled therapeutic intervention in order to improve on the following deficits Pain;Increased muscle spasms;Decreased strength;Impaired flexibility   Rehab Potential Good   PT Frequency 2x / week   PT Duration 4 weeks   PT Treatment/Interventions Ultrasound;Patient/family education;Passive range of motion;Cryotherapy;Electrical Stimulation;Therapeutic exercise;Manual techniques;Moist Heat   PT Next Visit Plan pain control, ther. ex. for improving flexiblity and strength, manual therapy to improve ROM, decrease pain        Problem List Patient Active Problem  List   Diagnosis Date Noted  . Fibromyalgia 05/26/2014  . Headache 05/26/2014  . 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, [place], [address] Phone: [phone]   Fax:  [phone]

## 2014-08-21 ENCOUNTER — Ambulatory Visit: Payer: Managed Care, Other (non HMO) | Admitting: Physical Therapy

## 2014-08-21 ENCOUNTER — Encounter: Payer: Self-pay | Admitting: Physical Therapy

## 2014-08-21 DIAGNOSIS — M25512 Pain in left shoulder: Secondary | ICD-10-CM | POA: Diagnosis not present

## 2014-08-21 DIAGNOSIS — M6281 Muscle weakness (generalized): Secondary | ICD-10-CM

## 2014-08-22 NOTE — Therapy (Signed)
[facility] [facility] [address] [place], [address] Phone: [phone]   Fax:  [phone]  Physical Therapy Treatment/Discharge Summary  Patient Details  Name: Carrie James MRN: [id number] Date of Birth: [birth year] Referring Provider:  Kathrynn Ducking, MD  Encounter Date: 08/21/2014   Patient has attended 15 of 20 scheduled visits for this episode of physical therapy. She has achieved or partially met all goals and agrees to discharge from physical therapy at this time.      PT End of Session - 08/21/14 1830    Visit Number 15   Number of Visits 20   Date for PT Re-Evaluation 08/21/14   PT Start Time 7591   PT Stop Time 1825   PT Time Calculation (min) 48 min   Activity Tolerance Patient tolerated treatment well   Behavior During Therapy [facility] for tasks assessed/performed      Past Medical History  Diagnosis Date  . Diabetes mellitus without complication   . Hyperlipidemia   . Fibromyalgia   . Obesity   . 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 Varicose vein of leg   . PUD (peptic ulcer disease)     Past Surgical History  Procedure Laterality Date  . Neck surgery  2002, 2005    fusion    There were no vitals filed for this visit.  Visit Diagnosis:  Pain in left shoulder  Muscle weakness (generalized)      Subjective Assessment - 08/21/14 1739    Subjective Patient reports she is feeling better and still cannot put arm behind her head or back (rotations). She is able to do exercises at home and feels a great deal better than when she began physical therapy. She has much less  Pain and is able to work through most of her stiffness and modify her activity to avoid increased pain in left shoulder.    Currently in Pain? Yes   Pain Score 2    Pain Location Shoulder   Pain Orientation Left   Pain Descriptors / Indicators Aching;Spasm   Pain Type Chronic pain   Pain Onset More than a month  ago   Pain Frequency Intermittent   Aggravating Factors  sudden movements and going behind back increases shoulder pain   Pain Relieving Factors rest, ice, heat   Multiple Pain Sites No      Objective: Outcome measure: QuickDash:  39%,  Work module:  28% AROM: left shoulder forward elevation: seated: 130 degrees, ER 50, IR 30 Strength: strong and non painful for forward elevation, ER and IR, improved control of scapular muscles for correct alignment of left shoulder during exercises/functional tasks  Soft tissue assessment: decreased mobility in posterior aspect of left shoulder, decreased mobility in pectoral muscles       [facility] Adult PT Treatment/Exercise - 08/21/14 1740    Exercises   Exercises Other Exercises   Other Exercises  with patient supine lying on table: AAROM left shoulder, in conjunction with STM and joint mobilization, rhythmic stabilization with right UE at 90 degrees 2 sets of 10    Modalities   Modalities Electrical Stimulation;Moist Heat   Moist Heat Therapy   Number Minutes Moist Heat 20 Minutes   Moist Heat Location Shoulder   Electrical Stimulation   Electrical Stimulation Location left shoulder   Electrical Stimulation Parameters high volt estim. to anterior and posterior aspect of left shoulder and Russian stim. applied 10/10 cycle to  posterior aspect of shoulder along medial border of scapula and lower trapezius muscle with patient seated in chair   Electrical Stimulation Goals Neuromuscular facilitation;Pain   Manual Therapy   Manual Therapy Joint mobilization;Soft tissue mobilization   Manual therapy comments decreased capsular mobility inferriorly and anteriorly and posteriorly    Joint Mobilization GHJ mobilization grade 2-3 with oscillations end range, 3-5 sets with left arm at side with patient supine on treatment table:instructed patient in self mobilization for inferior, posterior glides with written instructions given   Soft tissue mobilization STM  anterior aspect of left shoulder with patient supine lying anterior aspect of shoulder/ posterior aspect of shoulder    Patient response to treatment: improved soft tissue elasticity with improved AAROM with less difficulty/pain at end ranges ER and FF, patient demonstrated good knowledge of home program for self management of left shoulder self mobilization and exercises following demonstration and with verbal cuing       PT Education - 08/21/14 1744    Education provided Yes   Education Details re assessed home program: left shoulder flexibility exercises, self mobilization for inferior glide, scapular stabilization and pain control   Person(s) Educated Patient   Methods Explanation;Verbal cues   Comprehension Verbalized understanding;Returned demonstration             PT Long Term Goals - 08/21/14 1748    PT LONG TERM GOAL #1   Title pt. will improve quickDASH to 45% to demonstrate improvement with self perceived disability with ADL's   Status Achieved   PT LONG TERM GOAL #2   Title pt. will report decreased pain level to max 5/10 with aggravating activities    Status Achieved   PT LONG TERM GOAL #3   Title pt. will be independent with home program without cuing for pain control, exercise for flexibility, strength to improve ADL function    Status Achieved   PT LONG TERM GOAL #4   Title pt. will improve quickDASH to 30% or less to demonstrate improvement with ADL's with UE for daily activities at home and work   Status Partially Met   PT LONG TERM GOAL #5   Title Pt. will have as close to full AROM shoulder to allow improved function with overhead activities    Status Partially Met               Plan - 08/21/14 1833    Clinical Impression Statement Patient demonstrates good knowlegde of home exercises and has achieved or partially met all goals. She continues with stiffness and pain in left shoulder that limits functional use with personal care and using left UE for  reaching and overhead or behind back activities. She is learning to modify activity and exercisess to not aggravate pain and should continue to imrpove with self management of home exercises and pain control strategies. Recommend discharge from physical therapy at this time.    Rehab Potential Good   PT Frequency 2x / week   PT Duration 4 weeks   PT Treatment/Interventions Ultrasound;Patient/family education;Passive range of motion;Cryotherapy;Electrical Stimulation;Therapeutic exercise;Manual techniques;Moist Heat        Problem List Patient Active Problem List   Diagnosis Date Noted  . Fibromyalgia 05/26/2014  . Headache 05/26/2014  . 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 [place], [address] Phone: [phone]   Fax:  [phone]

## 2014-08-26 ENCOUNTER — Ambulatory Visit: Payer: Managed Care, Other (non HMO) | Admitting: Physical Therapy

## 2014-09-02 ENCOUNTER — Encounter: Payer: Managed Care, Other (non HMO) | Admitting: Physical Therapy

## 2014-09-02 ENCOUNTER — Telehealth: Payer: Self-pay | Admitting: Neurology

## 2014-09-02 NOTE — Telephone Encounter (Signed)
Pt called to confirm appointment in July, would like a call back about ordering an MRI. Please call and advise @ [phone]

## 2014-09-02 NOTE — Telephone Encounter (Signed)
I called the patient. She stated her shoulder is still hurting and she would like to try to have a MRI while she is here 7/21 for her appointment. Dr. Anne HahnWillis ordered a MRI 07/07/14. I am sending this to Sue Lushndrea, MRI coordinator. I advised the patient should hear from someone in our office soon.

## 2014-09-09 ENCOUNTER — Encounter: Payer: Managed Care, Other (non HMO) | Admitting: Physical Therapy

## 2014-09-11 ENCOUNTER — Telehealth: Payer: Self-pay | Admitting: Adult Health

## 2014-09-11 ENCOUNTER — Ambulatory Visit: Payer: Managed Care, Other (non HMO) | Admitting: Adult Health

## 2014-09-11 NOTE — Telephone Encounter (Signed)
Patient [facility] and [facility] regarding a pain she is experiencing in her head. Please call and advise.

## 2014-09-11 NOTE — Telephone Encounter (Signed)
Called patient and she having facial pain. Carrie MilletMegan has not seen the patient yet . [name] will you call the patient and follow up with her.

## 2014-09-12 NOTE — Telephone Encounter (Signed)
I called the patient. She c/o a new headache since being in therapy. She ended therapy at the end of June. She stated that she is now having a burning pain on the left side of her face and sometimes it feels like a lightning bolt is going through her head. She states that she has noticed her left eye spasming periodically. She has also noticed that when she wakes up in the mornings her left arm is weaker than it used to be. It takes a long time for it to "wake up" and not feel numb. She states has a MRI scheduled for 7/13. She wanted to let Dr. Anne HahnWillis know about the changes that are going on.

## 2014-09-12 NOTE — Telephone Encounter (Signed)
I called the patient and left a message.

## 2014-09-12 NOTE — Telephone Encounter (Signed)
I called patient. The patient is having sharp pains in the back of the head going up to the top of the head. 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 She is having some neck discomfort, she will have MRI of the cervical spine on July 13. If this is unremarkable, we may consider getting her in for occipital nerve block procedure.

## 2014-09-12 NOTE — Telephone Encounter (Signed)
Patient returned call. Please call and advise.  °

## 2014-09-16 ENCOUNTER — Encounter: Payer: Managed Care, Other (non HMO) | Admitting: Physical Therapy

## 2014-09-17 ENCOUNTER — Ambulatory Visit (INDEPENDENT_AMBULATORY_CARE_PROVIDER_SITE_OTHER): Payer: Managed Care, Other (non HMO)

## 2014-09-17 DIAGNOSIS — M542 Cervicalgia: Secondary | ICD-10-CM

## 2014-09-19 ENCOUNTER — Telehealth: Payer: Self-pay | Admitting: Neurology

## 2014-09-19 NOTE — Telephone Encounter (Signed)
I called the patient. The MRI of the cervical spine looks ok, the patient has had left occipital pain. She does not wish to have the occipital nerve injections done at this time.   MRI cervical 09/19/14:  IMPRESSION:  Abnormal MRI cervical spine (without) demonstrating: 1. At C3-4: disc bulging with mild right and moderate left foraminal stenosis 2. At C7-T1: disc bulging and uncovertebral joint hypertrophy with mild right foraminal stenosis  3. Bone fusion of C4-C7, with anterior vertebral body screws at C4 and C5.  4. No significant change from MRI on 03/01/08.

## 2014-09-25 ENCOUNTER — Ambulatory Visit: Payer: Managed Care, Other (non HMO) | Admitting: Adult Health

## 2014-09-25 ENCOUNTER — Encounter: Payer: Self-pay | Admitting: Adult Health

## 2014-09-25 ENCOUNTER — Ambulatory Visit (INDEPENDENT_AMBULATORY_CARE_PROVIDER_SITE_OTHER): Payer: Managed Care, Other (non HMO) | Admitting: Adult Health

## 2014-09-25 VITALS — BP 109/66 | HR 68 | Ht 62.0 in | Wt 170.0 lb

## 2014-09-25 DIAGNOSIS — M7502 Adhesive capsulitis of left shoulder: Secondary | ICD-10-CM

## 2014-09-25 DIAGNOSIS — M797 Fibromyalgia: Secondary | ICD-10-CM | POA: Diagnosis not present

## 2014-09-25 NOTE — Progress Notes (Signed)
PATIENT: Carrie James DOB: [birth year]  REASON FOR VISIT: follow up- frozen shoulder syndrome on the left, fibromyalgia HISTORY FROM: patient  HISTORY OF PRESENT ILLNESS: Carrie James is a 54 year old female with a history of fibromyalgia and frozen shoulder syndrome on the left. She returns today for an evaluation. The patient states that she's completed 15 sessions of physical therapy without much benefit. She still has limited mobility of the left shoulder. She states that occasionally she'll also have an electric-like pain that originates from the shoulder and travels up the side of the neck to the temporal area. The patient was also on mobic for 6 weeks without much benefit. She states that she tries to continue exercising her arm at home however this has caused pain and she had to stop for a while. She states that she now notices some limited movement and weakness in the left thumb and pinky finger. She returns today for an evaluation.  HISTORY 05/26/14 (WILLIS): Carrie James is a 54 year old right-handed [place] female with a history of fibromyalgia. The patient has been seen through this office previously, lasting on 09/01/2010. The patient is on no medications for her fibromyalgia, she is managing the chronic pain with stretching exercises and mild physical activity. The patient will walk about 7 miles a day. She stretches out her neck and shoulders regularly. Within the last month, she has developed a different type of pain in the left shoulder. The pain is associated with restriction of movement of the left arm, she is unable to lift the arm above her head. She has sharp jabbing pains at times. She indicates that she did get a steroid injection in the shoulder which seemed to help the mobility some, but the pain continues. She has had plain x-rays of the shoulder and she was told were unremarkable. The patient did have discomfort across the shoulders, into the back of the head, with occasional  cervicogenic headache. The patient has back pain as well. She has a prior history of cervical spine surgery. She does have some occasional numbness and tingling in the ulnar aspect of the left hand, and she had some arthritic pain in the PIP joints of the left hand and saddle joint of the left thumb. She comes to this office for further evaluation.  REVIEW OF SYSTEMS: Out of a complete 14 system review of symptoms, the patient complains only of the following symptoms, and all other reviewed systems are negative.  Abdominal pain, nausea, blurred vision, facial swelling, ringing in ears, back pain, aching muscles, walking difficulty, neck pain, neck stiffness, numbness, headache, weakness  ALLERGIES: No Known Allergies  HOME MEDICATIONS: Outpatient Prescriptions Prior to Visit  Medication Sig Dispense Refill  . atorvastatin (LIPITOR) 20 MG tablet Take 20 mg by mouth daily.    . Cholecalciferol 1000 UNITS tablet Take 2,000 Units by mouth daily.    . insulin aspart (NOVOLOG) 100 UNIT/ML injection Inject 15 Units into the skin daily.    . insulin detemir (LEVEMIR) 100 UNIT/ML injection Inject 54 Units into the skin daily.    . sitaGLIPtin (JANUVIA) 25 MG tablet Take 25 mg by mouth daily.    . Vitamin D, Ergocalciferol, (DRISDOL) 50000 UNITS CAPS capsule Take 5,000 Units by mouth once a week.    . meloxicam (MOBIC) 15 MG tablet Take 1 tablet by mouth  daily (Patient not taking: Reported on 09/25/2014) 90 tablet 0   No facility-administered medications prior to visit.    PAST MEDICAL HISTORY: Past  Medical History  Diagnosis Date  . Diabetes mellitus without complication   . Hyperlipidemia   . Fibromyalgia   . Obesity   . 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 Varicose vein of leg   . PUD (peptic ulcer disease)     PAST SURGICAL HISTORY: Past Surgical History  Procedure Laterality Date  . Neck surgery  2002, 2005    fusion    FAMILY HISTORY: Family History  Problem  Relation Age of Onset  . Heart attack Mother   . Healthy Father   . Healthy Sister   . Healthy Brother   . Healthy Brother     SOCIAL HISTORY: History   Social History  . Marital Status: Married    Spouse Name: N/A  . Number of Children: N/A  . Years of Education: N/A   Occupational History  . Not on file.   Social History Main Topics  . Smoking status: Never Smoker   . Smokeless tobacco: Never Used  . Alcohol Use: No  . Drug Use: No  . Sexual Activity: Not on file   Other Topics Concern  . Not on file   Social History Narrative   Patient is right handed.   Patient drinks 3 cups of caffeine per day.      PHYSICAL EXAM  Filed Vitals:   09/25/14 1030  BP: 109/66  Pulse: 68  Height: 5\' 2"  (1.575 m)  Weight: 170 lb (77.111 kg)   Body mass index is 31.09 kg/(m^2).  Generalized: Well developed, in no acute distress   Neurological examination  Mentation: Alert oriented to time, place, history taking. Follows all commands speech and language fluent Cranial nerve II-XII: Pupils were equal round reactive to light. Extraocular movements were full, visual field were full on confrontational test. Facial sensation and strength were normal. Uvula tongue midline. Head turning and shoulder shrug  were normal and symmetric. Motor: The motor testing reveals 5 over 5 strength of all 4 extremities. Good symmetric motor tone is noted throughout. Limited mobility in the left shoulder. She is able to abduct arm only 90. Sensory: Sensory testing is intact to soft touch on all 4 extremities. No evidence of extinction is noted.  Coordination: Cerebellar testing reveals good finger-nose-finger and heel-to-shin bilaterally.  Gait and station: Gait is normal. Tandem gait is normal. Romberg is negative. No drift is seen.  Reflexes: Deep tendon reflexes are symmetric and normal bilaterally.   DIAGNOSTIC DATA (LABS, IMAGING, TESTING) - I reviewed patient records, labs, notes, testing and  imaging myself where available.     ASSESSMENT AND PLAN 54 y.o. year old female  has a past medical history of Diabetes mellitus without complication; Hyperlipidemia; Fibromyalgia; Obesity; Frozen shoulder syndrome (05/26/2014); Varicose vein of leg; and PUD (peptic ulcer disease). here with:  1. Frozen shoulder syndrome on the left 2. Fibromyalgia  The patient continues to have limited mobility and pain in the left shoulder. She has completed physical therapy as well as 6 weeks on Mobic without much benefit. I will refer the patient to orthopedics. Patient is amenable to this. She will follow-up in 6 months or sooner if needed.  Butch Penny, MSN, NP-C 09/25/2014, 10:40 AM [facility] [address], [address] [place], [place] [address] [phone]  Note: This document was prepared with digital dictation and possible smart phrase technology. Any transcriptional errors that result from this process are unintentional.

## 2014-09-25 NOTE — Patient Instructions (Signed)
We will refer to orthopedics

## 2014-09-25 NOTE — Progress Notes (Signed)
I agree with the above plan 

## 2014-10-02 LAB — LIPID PANEL
Cholesterol: 205 mg/dL — AB (ref 0–200)
HDL: 50 mg/dL (ref 35–70)
LDL CALC: 23 mg/dL
Triglycerides: 113 mg/dL (ref 40–160)

## 2014-10-02 LAB — HEMOGLOBIN A1C: Hgb A1c MFr Bld: 7 % — AB (ref 4.0–6.0)

## 2014-10-09 ENCOUNTER — Other Ambulatory Visit: Payer: Self-pay | Admitting: Orthopedic Surgery

## 2014-10-09 DIAGNOSIS — M25512 Pain in left shoulder: Secondary | ICD-10-CM

## 2014-10-14 ENCOUNTER — Ambulatory Visit
Admission: RE | Admit: 2014-10-14 | Discharge: 2014-10-14 | Disposition: A | Discharge status: Home / Self Care | Payer: Managed Care, Other (non HMO) | Source: Ambulatory Visit | Attending: Orthopedic Surgery | Admitting: Orthopedic Surgery

## 2014-10-14 DIAGNOSIS — M25412 Effusion, left shoulder: Secondary | ICD-10-CM | POA: Insufficient documentation

## 2014-10-14 DIAGNOSIS — M19012 Primary osteoarthritis, left shoulder: Secondary | ICD-10-CM | POA: Insufficient documentation

## 2014-10-14 DIAGNOSIS — M7582 Other shoulder lesions, left shoulder: Secondary | ICD-10-CM | POA: Insufficient documentation

## 2014-10-14 DIAGNOSIS — M25512 Pain in left shoulder: Secondary | ICD-10-CM

## 2014-10-29 ENCOUNTER — Ambulatory Visit (INDEPENDENT_AMBULATORY_CARE_PROVIDER_SITE_OTHER): Payer: Managed Care, Other (non HMO) | Admitting: Family Medicine

## 2014-10-29 ENCOUNTER — Encounter: Payer: Self-pay | Admitting: Family Medicine

## 2014-10-29 VITALS — BP 110/70 | HR 60 | Temp 98.1°F | Resp 16 | Ht 61.0 in | Wt 171.0 lb

## 2014-10-29 DIAGNOSIS — E7800 Pure hypercholesterolemia, unspecified: Secondary | ICD-10-CM | POA: Insufficient documentation

## 2014-10-29 DIAGNOSIS — J45909 Unspecified asthma, uncomplicated: Secondary | ICD-10-CM | POA: Insufficient documentation

## 2014-10-29 DIAGNOSIS — IMO0002 Reserved for concepts with insufficient information to code with codable children: Secondary | ICD-10-CM | POA: Insufficient documentation

## 2014-10-29 DIAGNOSIS — Z794 Long term (current) use of insulin: Secondary | ICD-10-CM | POA: Insufficient documentation

## 2014-10-29 DIAGNOSIS — J302 Other seasonal allergic rhinitis: Secondary | ICD-10-CM

## 2014-10-29 DIAGNOSIS — M5090 Cervical disc disorder, unspecified, unspecified cervical region: Secondary | ICD-10-CM | POA: Insufficient documentation

## 2014-10-29 DIAGNOSIS — J309 Allergic rhinitis, unspecified: Secondary | ICD-10-CM | POA: Insufficient documentation

## 2014-10-29 DIAGNOSIS — E118 Type 2 diabetes mellitus with unspecified complications: Secondary | ICD-10-CM | POA: Insufficient documentation

## 2014-10-29 DIAGNOSIS — K259 Gastric ulcer, unspecified as acute or chronic, without hemorrhage or perforation: Secondary | ICD-10-CM | POA: Insufficient documentation

## 2014-10-29 DIAGNOSIS — J4531 Mild persistent asthma with (acute) exacerbation: Secondary | ICD-10-CM | POA: Diagnosis not present

## 2014-10-29 DIAGNOSIS — E668 Other obesity: Secondary | ICD-10-CM | POA: Insufficient documentation

## 2014-10-29 MED ORDER — FLUTICASONE-SALMETEROL 250-50 MCG/DOSE IN AEPB: 1.0000 | INHALATION_SPRAY | Freq: Two times a day (BID) | RESPIRATORY_TRACT | Status: DC

## 2014-10-29 MED ORDER — PREDNISONE 10 MG PO TABS: ORAL_TABLET | ORAL | Status: DC

## 2014-10-29 MED ORDER — FLUTICASONE PROPIONATE 50 MCG/ACT NA SUSP: 2.0000 | Freq: Every day | NASAL | Status: DC

## 2014-10-29 MED ORDER — ALBUTEROL SULFATE HFA 108 (90 BASE) MCG/ACT IN AERS: 2.0000 | INHALATION_SPRAY | Freq: Four times a day (QID) | RESPIRATORY_TRACT | Status: DC | PRN

## 2014-10-29 NOTE — Progress Notes (Signed)
Patient ID: Carrie James, female   DOB: [birth year], 54 y.o.   MRN: [id number]        Patient: Carrie James Female    DOB: 16-Jul-1960   54 y.o.   MRN: [id number] Visit Date: 10/29/2014  Today's Provider: Lorie Phenix, MD   Chief Complaint  Patient presents with  . URI   Subjective:    URI  This is a new problem. The current episode started in the past 7 days. The problem has been unchanged. There has been no fever. Associated symptoms include congestion, headaches, nausea and sinus pain. Pertinent negatives include no coughing, ear pain, plugged ear sensation, rhinorrhea, sneezing, sore throat, vomiting or wheezing. She has tried antihistamine for the symptoms. The treatment provided mild relief.  Patient reports that she took a dose of Zyrtec.  Patient reports that shortness of breath on exertion.  Feels she not breathing well.  Feels tight when she breathes.  Has been wheezing in the past.     No Known Allergies Previous Medications   ATORVASTATIN (LIPITOR) 20 MG TABLET    Take 20 mg by mouth daily.   CHOLECALCIFEROL 1000 UNITS TABLET    Take 2,000 Units by mouth daily.   INSULIN ASPART (NOVOLOG) 100 UNIT/ML INJECTION    Inject 15 Units into the skin daily.   INSULIN DETEMIR (LEVEMIR) 100 UNIT/ML INJECTION    Inject 54 Units into the skin daily.   SITAGLIPTIN (JANUVIA) 25 MG TABLET    Take 25 mg by mouth daily.   VITAMIN D, ERGOCALCIFEROL, (DRISDOL) 50000 UNITS CAPS CAPSULE    Take 5,000 Units by mouth once a week.    Review of Systems  HENT: Positive for congestion. Negative for ear pain, rhinorrhea, sneezing and sore throat.   Respiratory: Negative for cough and wheezing.   Gastrointestinal: Positive for nausea. Negative for vomiting.  Neurological: Positive for headaches.    Social History  Substance Use Topics  . Smoking status: Never Smoker   . Smokeless tobacco: Never Used  . Alcohol Use: No   Objective:   BP 110/70 mmHg  Pulse 60  Temp(Src) 98.1 F (36.7 C)  (Oral)  Resp 16  Ht 5\' 1"  (1.549 m)  Wt 171 lb (77.565 kg)  BMI 32.33 kg/m2  SpO2 99%  Physical Exam  Constitutional: She is oriented to person, place, and time. She appears well-developed and well-nourished.  HENT:  Head: Normocephalic and atraumatic.  Right Ear: External ear normal.  Left Ear: External ear normal.  Mouth/Throat: Oropharynx is clear and moist.  Eyes: Conjunctivae and EOM are normal. Pupils are equal, round, and reactive to light.  Neck: Normal range of motion. Neck supple.  Cardiovascular: Normal rate and regular rhythm.   Pulmonary/Chest: Effort normal and breath sounds normal.  Breath sounds decreased before nebulizer. Improved after nebulizer.   Neurological: She is alert and oriented to person, place, and time.  Psychiatric: She has a normal mood and affect. Her behavior is normal. Judgment and thought content normal.        Assessment & Plan:     1. Asthma, mild persistent, with acute exacerbation. Condition is worsening. Will start medication for better control.  Patient instructed to call back if condition worsens or does not improve.   Did improve with nebulizer in the office.  - Fluticasone-Salmeterol (ADVAIR DISKUS) 250-50 MCG/DOSE AEPB; Inhale 1 puff into the lungs 2 (two) times daily.  Dispense: 60 each; Refill: 5 - albuterol (PROVENTIL HFA;VENTOLIN HFA) 108 (90 BASE) MCG/ACT  inhaler; Inhale 2 puffs into the lungs every 6 (six) hours as needed for wheezing or shortness of breath.  Dispense: 1 Inhaler; Refill: 0 - levalbuterol (XOPENEX) nebulizer solution 1.25 mg; Take 1.25 mg by nebulization once as needed for wheezing or shortness of breath.  2. Other seasonal allergic rhinitis Restart medication.  Patient instructed to call back if condition worsens or does not improve.    - fluticasone (FLONASE) 50 MCG/ACT nasal spray; Place 2 sprays into both nostrils daily.  Dispense: 16 g; Refill: 6      Lorie Phenix, MD  [facility]

## 2014-10-30 MED ORDER — LEVALBUTEROL HCL 1.25 MG/3ML IN NEBU: 1.2500 mg | INHALATION_SOLUTION | Freq: Once | RESPIRATORY_TRACT | Status: DC | PRN

## 2014-12-02 ENCOUNTER — Telehealth: Payer: Self-pay | Admitting: Family Medicine

## 2014-12-02 NOTE — Telephone Encounter (Signed)
Pt called wanting her daughter Morrison Old [birth year]19[birth year]1990 to be seen for a physical.  You have never seen her.  Can we schedule her an appt.  Call back is [phone]  Thanks teri

## 2014-12-02 NOTE — Telephone Encounter (Signed)
OK. Thanks.

## 2014-12-15 DIAGNOSIS — R0789 Other chest pain: Secondary | ICD-10-CM | POA: Insufficient documentation

## 2014-12-15 DIAGNOSIS — R9431 Abnormal electrocardiogram [ECG] [EKG]: Secondary | ICD-10-CM | POA: Insufficient documentation

## 2014-12-25 NOTE — Telephone Encounter (Signed)
appt made

## 2015-01-16 ENCOUNTER — Encounter: Payer: Self-pay | Admitting: Family Medicine

## 2015-01-16 ENCOUNTER — Ambulatory Visit (INDEPENDENT_AMBULATORY_CARE_PROVIDER_SITE_OTHER): Payer: Managed Care, Other (non HMO) | Admitting: Family Medicine

## 2015-01-16 VITALS — BP 106/66 | HR 64 | Temp 97.9°F | Resp 16 | Ht 61.0 in | Wt 173.0 lb

## 2015-01-16 DIAGNOSIS — Z124 Encounter for screening for malignant neoplasm of cervix: Secondary | ICD-10-CM

## 2015-01-16 DIAGNOSIS — E119 Type 2 diabetes mellitus without complications: Secondary | ICD-10-CM | POA: Diagnosis not present

## 2015-01-16 DIAGNOSIS — Z0000 Encounter for general adult medical examination without abnormal findings: Secondary | ICD-10-CM

## 2015-01-16 DIAGNOSIS — Z1239 Encounter for other screening for malignant neoplasm of breast: Secondary | ICD-10-CM | POA: Diagnosis not present

## 2015-01-16 NOTE — Patient Instructions (Addendum)
Please call the [facility] to schedule this at [phone]   Please keep follow-up appointment with Dr. Tedd SiasSolum  Please continue working on healthy lifestyle.

## 2015-01-16 NOTE — Progress Notes (Signed)
Patient ID: Carrie James, female   DOB: 12-Sep-1960, 54 y.o.   MRN: [id number]        Patient: Carrie James, Female    DOB: [birth year], 54 y.o.   MRN: [id number] Visit Date: 01/16/2015  Today's Provider: Lorie Phenix, MD   Chief Complaint  Patient presents with  . Annual Exam   Subjective:    Annual physical exam Carrie James is a 54 y.o. female who presents today for health maintenance and complete physical. She feels well. She reports exercising 2 days a week. She reports she is sleeping well. 12/09/13 CPE 12/09/13 Pap-mildly abnormal, HPV-neg 05/02/08 Colon-normal 11/12/13 Mammo-BI-RADS 1 03/15/07 EKG  Lab Results  Component Value Date   CHOL 205* 10/02/2014   TRIG 113 10/02/2014   HDL 50 10/02/2014   LDLCALC 23 10/02/2014   HGBA1C 7.0* 10/02/2014    -----------------------------------------------------------------   Review of Systems  Eyes: Negative.   Respiratory: Positive for shortness of breath.   Cardiovascular: Positive for leg swelling.  Gastrointestinal: Negative.   Endocrine: Negative.   Genitourinary: Positive for frequency.  Musculoskeletal: Positive for myalgias, back pain, arthralgias and neck pain.  Skin: Negative.   Allergic/Immunologic: Positive for environmental allergies.  Neurological: Positive for dizziness, weakness, numbness and headaches.  Hematological: Negative.   Psychiatric/Behavioral: Negative.     Social History She  reports that she has never smoked. She has never used smokeless tobacco. She reports that she does not drink alcohol or use illicit drugs. Social History   Social History  . Marital Status: Married    Spouse Name: N/A  . Number of Children: 2  . Years of Education: N/A   Social History Main Topics  . Smoking status: Never Smoker   . Smokeless tobacco: Never Used  . Alcohol Use: No  . Drug Use: No  . Sexual Activity: Not Asked   Other Topics Concern  . None   Social History Narrative   Patient is right  handed.   Patient drinks 3 cups of caffeine per day.    Patient Active Problem List   Diagnosis Date Noted  . Abnormal ECG 12/15/2014  . Atypical chest pain 12/15/2014  . Airway hyperreactivity 10/29/2014  . Adult BMI 30+ 10/29/2014  . Cervical disc disease 10/29/2014  . Gastric ulcer 10/29/2014  . Diabetes mellitus, type 2 (HCC) 10/29/2014  . Allergic rhinitis 10/29/2014  . Gastric ulcer without hemorrhage or perforation 10/29/2014  . Adiposity 10/29/2014  . Pure hypercholesterolemia 10/29/2014  . Fibromyalgia 05/26/2014  . Headache 05/26/2014  . 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 Adhesive capsulitis 05/26/2014    Past Surgical History  Procedure Laterality Date  . Neck surgery  2002, 2005    fusion    Family History  Family Status  Relation Status Death Age  . Mother Alive   . Father Alive   . Sister Alive   . Brother Alive   . Brother Alive    Her family history includes Healthy in her brother, brother, father, and sister; Heart attack in her mother; Heart disease in her mother.    No Known Allergies  Previous Medications   ALBUTEROL (PROVENTIL HFA;VENTOLIN HFA) 108 (90 BASE) MCG/ACT INHALER    Inhale 2 puffs into the lungs every 6 (six) hours as needed for wheezing or shortness of breath.   ATORVASTATIN (LIPITOR) 20 MG TABLET    Take 20 mg by mouth daily.   CHOLECALCIFEROL 1000 UNITS TABLET    Take 2,000 Units by mouth daily.  FLUTICASONE (FLONASE) 50 MCG/ACT NASAL SPRAY    Place 2 sprays into both nostrils daily.   FLUTICASONE-SALMETEROL (ADVAIR DISKUS) 250-50 MCG/DOSE AEPB    Inhale 1 puff into the lungs 2 (two) times daily.   INSULIN ASPART (NOVOLOG) 100 UNIT/ML INJECTION    Inject 15 Units into the skin daily.   INSULIN DETEMIR (LEVEMIR) 100 UNIT/ML INJECTION    Inject 54 Units into the skin daily.   ONE TOUCH ULTRA TEST TEST STRIP       ONETOUCH DELICA LANCETS 33G MISC       SITAGLIPTIN (JANUVIA) 25 MG TABLET    Take 25 mg by mouth daily.   VITAMIN  D, ERGOCALCIFEROL, (DRISDOL) 50000 UNITS CAPS CAPSULE    Take 5,000 Units by mouth once a week.    Patient Care Team: Lorie PhenixNancy Tevis Conger, MD as PCP - General (Family Medicine)     Objective:   Vitals: BP 106/66 mmHg  Pulse 64  Temp(Src) 97.9 F (36.6 C) (Oral)  Resp 16  Ht 5\' 1"  (1.549 m)  Wt 173 lb (78.472 kg)  BMI 32.70 kg/m2  SpO2 97%   Physical Exam  Constitutional: She is oriented to person, place, and time. She appears well-developed and well-nourished.  HENT:  Head: Normocephalic and atraumatic.  Right Ear: Tympanic membrane, external ear and ear canal normal.  Left Ear: Tympanic membrane, external ear and ear canal normal.  Nose: Nose normal.  Mouth/Throat: Uvula is midline, oropharynx is clear and moist and mucous membranes are normal.  Eyes: Conjunctivae, EOM and lids are normal. Pupils are equal, round, and reactive to light.  Neck: Trachea normal and normal range of motion. Neck supple. Carotid bruit is not present. No thyroid mass and no thyromegaly present.  Cardiovascular: Normal rate, regular rhythm and normal heart sounds.   Pulmonary/Chest: Effort normal and breath sounds normal.  Abdominal: Soft. Normal appearance and bowel sounds are normal. There is no hepatosplenomegaly. There is no tenderness.  Genitourinary: Vagina normal. No breast swelling, tenderness or discharge.  Musculoskeletal: Normal range of motion.  Lymphadenopathy:    She has no cervical adenopathy.    She has no axillary adenopathy.  Neurological: She is alert and oriented to person, place, and time. She has normal strength. No cranial nerve deficit.  Skin: Skin is warm, dry and intact.  Psychiatric: She has a normal mood and affect. Her speech is normal and behavior is normal. Judgment and thought content normal. Cognition and memory are normal.     Depression Screen PHQ 2/9 Scores 01/16/2015  PHQ - 2 Score 0      Assessment & Plan:     Routine Health Maintenance and Physical  Exam  Exercise Activities and Dietary recommendations Goals    . Exercise 150 minutes per week (moderate activity)         1. Annual physical exam. Stable. Patient advised to continue eating healthy and exercise daily.  2. Diabetes mellitus with no complication (HCC) Stable. Patient advised to keep follow-up with Dr. Tedd SiasSolum.  3. Breast cancer screening - MM DIGITAL SCREENING BILATERAL; Future  4. Cervical cancer screening F/U pending lab report. - Pap IG (Image Guided)     Patient seen and examined by Dr. Leo GrosserNancy J.. Georgina Krist, and note scribed by Liz BeachSulibeya S. Dimas, CMA.  I have reviewed the document for accuracy and completeness and I agree with above. Leo Grosser- Tonji Elliff J. Ripley Bogosian, MD   Lorie PhenixNancy Gianne Shugars, MD    --------------------------------------------------------------------

## 2015-01-22 LAB — PAP IG (IMAGE GUIDED): PAP Smear Comment: 0

## 2015-04-01 ENCOUNTER — Ambulatory Visit: Payer: Managed Care, Other (non HMO) | Admitting: Adult Health

## 2015-04-23 ENCOUNTER — Ambulatory Visit (INDEPENDENT_AMBULATORY_CARE_PROVIDER_SITE_OTHER): Payer: Managed Care, Other (non HMO) | Admitting: Family Medicine

## 2015-04-23 ENCOUNTER — Encounter: Payer: Self-pay | Admitting: Family Medicine

## 2015-04-23 VITALS — BP 120/64 | HR 72 | Temp 98.2°F | Resp 16 | Ht 61.0 in | Wt 177.0 lb

## 2015-04-23 DIAGNOSIS — J029 Acute pharyngitis, unspecified: Secondary | ICD-10-CM | POA: Diagnosis not present

## 2015-04-23 DIAGNOSIS — J302 Other seasonal allergic rhinitis: Secondary | ICD-10-CM

## 2015-04-23 DIAGNOSIS — J0100 Acute maxillary sinusitis, unspecified: Secondary | ICD-10-CM

## 2015-04-23 DIAGNOSIS — J019 Acute sinusitis, unspecified: Secondary | ICD-10-CM | POA: Insufficient documentation

## 2015-04-23 LAB — CULTURE, GROUP A STREP: STREP A CULTURE: NEGATIVE

## 2015-04-23 MED ORDER — AMOXICILLIN-POT CLAVULANATE 875-125 MG PO TABS: 1.0000 | ORAL_TABLET | Freq: Two times a day (BID) | ORAL | Status: DC

## 2015-04-23 NOTE — Progress Notes (Signed)
Patient: Carrie James Female    DOB: May 24, 1960   55 y.o.   MRN: [id number] Visit Date: 04/23/2015  Today's Provider: Lorie Phenix, MD   Chief Complaint  Patient presents with  . Sore Throat  . Cough   Subjective:    Cough This is a new problem. The current episode started in the past 7 days. The problem has been gradually worsening (Started with itchy eyes last week. ). The problem occurs every few minutes. The cough is non-productive. Associated symptoms include chills, ear congestion, ear pain, nasal congestion, postnasal drip, rhinorrhea and a sore throat. Pertinent negatives include no chest pain, fever, heartburn, hemoptysis, myalgias, rash, shortness of breath, sweats, weight loss or wheezing. Headaches: around her eyes especially, left worse than right.  The symptoms are aggravated by exercise and lying down. Treatments tried: Did try cough medciation and Dayquil  The treatment provided mild relief. Her past medical history is significant for asthma and environmental allergies. There is no history of bronchitis, COPD or pneumonia.  Sore Throat  This is a new problem. The current episode started in the past 7 days. The problem has been gradually worsening. Sore throat worse side: both. There has been no fever. The pain is moderate. Associated symptoms include congestion, coughing, ear pain, a hoarse voice, swollen glands and trouble swallowing. Pertinent negatives include no abdominal pain, diarrhea, drooling, ear discharge, plugged ear sensation, neck pain, shortness of breath, stridor or vomiting. Headaches: around her eyes especially, left worse than right.  Treatments tried: dayquil, cough syrup, salt water rinse. The treatment provided mild relief.      No Known Allergies Previous Medications   ALBUTEROL (PROVENTIL HFA;VENTOLIN HFA) 108 (90 BASE) MCG/ACT INHALER    Inhale 2 puffs into the lungs every 6 (six) hours as needed for wheezing or shortness of breath.   ATORVASTATIN (LIPITOR) 20 MG TABLET    Take 20 mg by mouth daily.   CHOLECALCIFEROL 1000 UNITS TABLET    Take 2,000 Units by mouth daily. Reported on 04/23/2015   FLUTICASONE (FLONASE) 50 MCG/ACT NASAL SPRAY    Place 2 sprays into both nostrils daily.   FLUTICASONE-SALMETEROL (ADVAIR DISKUS) 250-50 MCG/DOSE AEPB    Inhale 1 puff into the lungs 2 (two) times daily.   INSULIN ASPART (NOVOLOG) 100 UNIT/ML INJECTION    Inject 15 Units into the skin daily.   INSULIN DETEMIR (LEVEMIR) 100 UNIT/ML INJECTION    Inject 54 Units into the skin daily.   METFORMIN (GLUCOPHAGE) 500 MG TABLET    Take 500 mg by mouth daily with breakfast.   ONE TOUCH ULTRA TEST TEST STRIP       ONETOUCH DELICA LANCETS 33G MISC       SITAGLIPTIN (JANUVIA) 25 MG TABLET    Take 25 mg by mouth daily.   VITAMIN D, ERGOCALCIFEROL, (DRISDOL) 50000 UNITS CAPS CAPSULE    Take 5,000 Units by mouth once a week.    Review of Systems  Constitutional: Positive for chills. Negative for fever, weight loss, diaphoresis, appetite change and fatigue.  HENT: Positive for congestion, ear pain, hoarse voice, postnasal drip, rhinorrhea, sore throat and trouble swallowing. Negative for drooling and ear discharge.   Eyes: Positive for itching (better ).  Respiratory: Positive for cough. Negative for hemoptysis, chest tightness, shortness of breath, wheezing and stridor.   Cardiovascular: Negative for chest pain and palpitations.  Gastrointestinal: Negative for heartburn, nausea, vomiting, abdominal pain and diarrhea.  Musculoskeletal: Negative for myalgias and  neck pain.  Skin: Negative for rash.  Allergic/Immunologic: Positive for environmental allergies.  Neurological: Negative for dizziness and weakness. Headaches: around her eyes especially, left worse than right.     Social History  Substance Use Topics  . Smoking status: Never Smoker   . Smokeless tobacco: Never Used  . Alcohol Use: No   Objective:   BP 120/64 mmHg  Pulse 72   Temp(Src) 98.2 F (36.8 C) (Oral)  Resp 16  Ht  (1.549 m)  Wt 177 lb (80.287 kg)  BMI 33.46 kg/m2  SpO2 97%  Physical Exam  Constitutional: She is oriented to person, place, and time. She appears well-developed and well-nourished.  HENT:  Head: Normocephalic and atraumatic.  Right Ear: Tympanic membrane and external ear normal.  Left Ear: Tympanic membrane and external ear normal.  Nose: Mucosal edema and rhinorrhea present. Right sinus exhibits maxillary sinus tenderness. Left sinus exhibits maxillary sinus tenderness.  Mouth/Throat: Uvula is midline and oropharynx is clear and moist.  Eyes: Conjunctivae and EOM are normal. Pupils are equal, round, and reactive to light.  Neck: Normal range of motion. Neck supple.  Cardiovascular: Normal rate and regular rhythm.   Pulmonary/Chest: Effort normal and breath sounds normal. She has no wheezes. She has no rales.  Neurological: She is alert and oriented to person, place, and time.      Assessment & Plan:     1. Sore throat Strep negative.     2. Acute maxillary sinusitis, recurrence not specified New problem.  Condition is worsening. Will start medication for better control.   - amoxicillin-clavulanate (AUGMENTIN) 875-125 MG tablet; Take 1 tablet by mouth 2 (two) times daily.  Dispense: 20 tablet; Refill: 0  3. Other seasonal allergic rhinitis Seasonal.  Will restart medication as needed.       Lorie Phenix, MD  [facility]

## 2015-04-24 ENCOUNTER — Telehealth: Payer: Self-pay

## 2015-04-24 NOTE — Telephone Encounter (Signed)
Advised pt of lab results. Pt verbally acknowledges understanding. Beckem Tomberlin Drozdowski, CMA   

## 2015-04-24 NOTE — Telephone Encounter (Signed)
-----   Message from Lorie Phenix, MD sent at 04/24/2015  8:43 AM EST ----- Strep negative. Thanks.

## 2015-04-24 NOTE — Telephone Encounter (Signed)
Left message to call back  

## 2015-04-27 ENCOUNTER — Ambulatory Visit: Payer: Managed Care, Other (non HMO) | Admitting: Family Medicine

## 2015-05-11 ENCOUNTER — Encounter: Payer: Self-pay | Admitting: Family Medicine

## 2015-05-11 ENCOUNTER — Ambulatory Visit (INDEPENDENT_AMBULATORY_CARE_PROVIDER_SITE_OTHER): Payer: Managed Care, Other (non HMO) | Admitting: Family Medicine

## 2015-05-11 VITALS — BP 108/68 | HR 64 | Temp 98.7°F | Resp 16 | Wt 176.0 lb

## 2015-05-11 DIAGNOSIS — J0100 Acute maxillary sinusitis, unspecified: Secondary | ICD-10-CM

## 2015-05-11 DIAGNOSIS — J302 Other seasonal allergic rhinitis: Secondary | ICD-10-CM | POA: Diagnosis not present

## 2015-05-11 MED ORDER — LEVOFLOXACIN 500 MG PO TABS: 500.0000 mg | ORAL_TABLET | Freq: Every day | ORAL | Status: DC

## 2015-05-11 MED ORDER — FLUTICASONE PROPIONATE 50 MCG/ACT NA SUSP: 2.0000 | Freq: Every day | NASAL | Status: AC

## 2015-05-11 NOTE — Progress Notes (Signed)
Subjective:    Patient ID: Charolett BumpersHema D Wegmann, female    DOB: [birth year], 55 y.o.   MRN: [id number]  Sinusitis This is a recurrent problem. Episode onset: about 1 month ago.  Was previously treated.  The problem has been waxing and waning since onset. There has been no fever. Her pain is at a severity of 5/10. The pain is moderate. Associated symptoms include congestion, coughing (dry), ear pain (left), headaches, a hoarse voice, shortness of breath, sinus pressure and a sore throat. Pertinent negatives include no chills, diaphoresis, sneezing or swollen glands. Past treatments include antibiotics. The treatment provided mild (Improved, but did not completely resolve. ) relief.  Pt was seen on 04/23/2015 for acute maxillary sinusitis. Pt was put on Augmentin. Pt reports she is NOT taking ProAir, Advair, or Flonase.  Symptoms are worsening again as noted. Never got 100 percent better.     Review of Systems  Constitutional: Positive for fatigue. Negative for fever, chills and diaphoresis.  HENT: Positive for congestion, ear pain (left), hoarse voice, sinus pressure and sore throat. Negative for sneezing.   Respiratory: Positive for cough (dry) and shortness of breath. Negative for wheezing.   Cardiovascular: Negative.   Neurological: Positive for headaches.   BP 108/68 mmHg  Pulse 64  Temp(Src) 98.7 F (37.1 C) (Oral)  Resp 16  Wt 176 lb (79.833 kg)  SpO2 100%   Patient Active Problem List   Diagnosis Date Noted  . Acute sinusitis 04/23/2015  . Abnormal ECG 12/15/2014  . Airway hyperreactivity 10/29/2014  . Adult BMI 30+ 10/29/2014  . Cervical disc disease 10/29/2014  . Diabetes mellitus, type 2 (HCC) 10/29/2014  . Allergic rhinitis 10/29/2014  . Gastric ulcer without hemorrhage or perforation 10/29/2014  . Adiposity 10/29/2014  . Pure hypercholesterolemia 10/29/2014  . Fibromyalgia 05/26/2014  . Headache 05/26/2014  . 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 Diabetes mellitus without complication (HCC)   . Hyperlipidemia   . Fibromyalgia   . Obesity   . 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 Varicose vein of leg   . PUD (peptic ulcer disease)    Current Outpatient Prescriptions on File Prior to Visit  Medication Sig  . atorvastatin (LIPITOR) 20 MG tablet Take 20 mg by mouth daily.  . Cholecalciferol 1000 UNITS tablet Take 2,000 Units by mouth daily. Reported on 04/23/2015  . insulin aspart (NOVOLOG) 100 UNIT/ML injection Inject 15 Units into the skin daily.  . insulin detemir (LEVEMIR) 100 UNIT/ML injection Inject 54 Units into the skin daily.  . metFORMIN (GLUCOPHAGE) 500 MG tablet Take 500 mg by mouth daily with breakfast.  . ONE TOUCH ULTRA TEST test strip   . ONETOUCH DELICA LANCETS 33G MISC   . albuterol (PROVENTIL HFA;VENTOLIN HFA) 108 (90 BASE) MCG/ACT inhaler Inhale 2 puffs into the lungs every 6 (six) hours as needed for wheezing or shortness of breath. (Patient not taking: Reported on 04/23/2015)  . fluticasone (FLONASE) 50 MCG/ACT nasal spray Place 2 sprays into both nostrils daily. (Patient not taking: Reported on 04/23/2015)  . Fluticasone-Salmeterol (ADVAIR DISKUS) 250-50 MCG/DOSE AEPB Inhale 1 puff into the lungs 2 (two) times daily. (Patient not taking: Reported on 04/23/2015)   No current facility-administered medications on file prior to visit.   No Known Allergies Past Surgical History  Procedure Laterality Date  . Neck surgery  2002, 2005    fusion   Social History   Social  History  . Marital Status: Married    Spouse Name: N/A  . Number of Children: 2  . Years of Education: N/A   Occupational History  . Not on file.   Social History Main Topics  . Smoking status: Never Smoker   . Smokeless tobacco: Never Used  . Alcohol Use: No  . Drug Use: No  . Sexual Activity: Not on file   Other Topics Concern  . Not on file   Social History Narrative   Patient is right handed.    Patient drinks 3 cups of caffeine per day.   Family History  Problem Relation Age of Onset  . Heart attack Mother   . Heart disease Mother   . Healthy Father   . Healthy Sister   . Healthy Brother   . Healthy Brother       Objective:   Physical Exam  Constitutional: She is oriented to person, place, and time. She appears well-developed and well-nourished.  HENT:  Head: Normocephalic and atraumatic.  Right Ear: Tympanic membrane and external ear normal.  Left Ear: Tympanic membrane and external ear normal.  Nose: Mucosal edema and rhinorrhea present. Right sinus exhibits maxillary sinus tenderness. Left sinus exhibits maxillary sinus tenderness.  Mouth/Throat: Uvula is midline and oropharynx is clear and moist.  Eyes: Conjunctivae and EOM are normal. Pupils are equal, round, and reactive to light.  Neck: Normal range of motion. Neck supple.  Cardiovascular: Normal rate and regular rhythm.   Pulmonary/Chest: Effort normal and breath sounds normal. She has no wheezes. She has no rales.  Neurological: She is alert and oriented to person, place, and time.   BP 108/68 mmHg  Pulse 64  Temp(Src) 98.7 F (37.1 C) (Oral)  Resp 16  Wt 176 lb (79.833 kg)  SpO2 100%     Assessment & Plan:  1. Other seasonal allergic rhinitis Not well controlled. Will restart FLonase. Patient instructed to call back if condition worsens or does not improve.    - levofloxacin (LEVAQUIN) 500 MG tablet; Take 1 tablet (500 mg total) by mouth daily.  Dispense: 7 tablet; Refill: 0  2. Acute maxillary sinusitis, recurrence not specified Recurrent problem. Unclear if new infection or recurrence of last months infection. Treat with antibiotic and call if worsens or does not improve. Warned about tendon issues with Levaquin.   Lorie Phenix, MD    - fluticasone [facility]) 50 MCG/ACT nasal spray; Place 2 sprays into both nostrils daily.  Dispense: 16 g; Refill: 6

## 2015-07-29 ENCOUNTER — Telehealth: Payer: Self-pay | Admitting: Family Medicine

## 2015-07-29 NOTE — Telephone Encounter (Signed)
Okay to order? Carrie James, CMA  

## 2015-07-29 NOTE — Telephone Encounter (Signed)
I can call in rx for her and her daughter. Do not think I have other family members as patients. Please confirm daughter's name and DOB. Thanks.

## 2015-07-29 NOTE — Telephone Encounter (Signed)
Pt called wanting to know if you can write for Typhoid vaccines for her family.  [place] are going to [place] June [place] th.   She said she and her daughter are patients but would like to have it written for the whole family if possible.  [facility] [place] has the vaccine but they need an order to give it.    Pt's call back is [phone]  Thanks Barth Kirkseri

## 2015-07-30 NOTE — Telephone Encounter (Signed)
Informed pt as below. Is asking for their vaccines to be [place] into [facility] [place]. Daughter's name is Ralph DowdyShailja Remo, 10/19/1990. Allene DillonEmily Drozdowski, CMA

## 2015-07-30 NOTE — Telephone Encounter (Signed)
[facility] [place] [facility] pt's Allene DillonEmily Drozdowski, CMA

## 2015-07-30 NOTE — Telephone Encounter (Signed)
OK to call in rxs. Thanks.

## 2015-08-19 ENCOUNTER — Telehealth: Payer: Self-pay | Admitting: Family Medicine

## 2015-08-19 NOTE — Telephone Encounter (Signed)
Pt would like to know the name of the medication she is allergic to.  She states we should have in her chart.  Pt would like a message left with the name of the drug on [phone] today if possible.

## 2015-08-19 NOTE — Telephone Encounter (Signed)
Informed pt that there are no allergies listed in her chart; also no allergies were found in Care Everywhere. Allene DillonEmily Drozdowski, CMA

## 2016-02-09 ENCOUNTER — Encounter: Payer: Self-pay | Admitting: Physician Assistant

## 2016-02-09 ENCOUNTER — Ambulatory Visit (INDEPENDENT_AMBULATORY_CARE_PROVIDER_SITE_OTHER): Payer: Managed Care, Other (non HMO) | Admitting: Physician Assistant

## 2016-02-09 VITALS — BP 166/72 | HR 72 | Temp 98.0°F | Resp 16 | Ht 62.0 in | Wt 179.0 lb

## 2016-02-09 DIAGNOSIS — M797 Fibromyalgia: Secondary | ICD-10-CM | POA: Diagnosis not present

## 2016-02-09 DIAGNOSIS — Z794 Long term (current) use of insulin: Secondary | ICD-10-CM

## 2016-02-09 DIAGNOSIS — E7800 Pure hypercholesterolemia, unspecified: Secondary | ICD-10-CM

## 2016-02-09 DIAGNOSIS — Z1239 Encounter for other screening for malignant neoplasm of breast: Secondary | ICD-10-CM

## 2016-02-09 DIAGNOSIS — Z0000 Encounter for general adult medical examination without abnormal findings: Secondary | ICD-10-CM

## 2016-02-09 DIAGNOSIS — Z1231 Encounter for screening mammogram for malignant neoplasm of breast: Secondary | ICD-10-CM | POA: Diagnosis not present

## 2016-02-09 DIAGNOSIS — E118 Type 2 diabetes mellitus with unspecified complications: Secondary | ICD-10-CM

## 2016-02-09 NOTE — Progress Notes (Signed)
Patient: Carrie James, Female    DOB: March 30, 1960, 55 y.o.   MRN: [id number] Visit Date: 02/09/2016  Today's Provider: Trey Sailors, PA-C   Chief Complaint  Patient presents with  . Annual Exam   Subjective:    Annual physical exam Carrie James is a 55 y.o. female who presents today for health maintenance and complete physical. She feels well. She reports exercising some, but not much lately. She reports she is sleeping well.  She has gotten her flu shot at [facility] on Feb 15, 2016. She is due for Tdap and pneumococcal, but must be one month apart from flu. Will schedule f/u for these shots.  Patient is a 55 y/o female with a history of Type II Dm, cervical disc disease, HLD.   She works at [employer] for the past 12 years, lives in [place] with her husband of 30 years. She has two children, 25 and 23. She moved here from [place], and before that from [place].  She sees Dr. Lady Gary in cardiology. She has recently had stress echo for chest discomfort, which was normal. She has HLD, for which she takes atorvastatin.  She has some airway reactivity, for which she takes albuterol inhaler. She takes this as needed only and hasn't used it recently.  She is followed by Dr. Tedd Sias in endocrinology for her diabetes. She has an appointment with her on 02/12/2016. She has had a medication regimen change in the past year after she went to [place] and stopped taking her insulin. She is now on oral medications only. She reports she checks her blood sugars in the morning and before bed. She reports her fasting blood sugars have been around 135. She got her eye exam in 01/2016. She gets her foot exams done by Dr. Tedd Sias. She reports Dr. Tedd Sias usually gets her lipid panel, CMET, A1C, urine microalbumin, TSH.   She had a colonoscopy in 2010 because of some abdominal discomfort after surgery, and this revealed an ulcer, which surgeon told her was 2/2 pain medications. No polyps. No family history of GI  cancer.  She has a history of "mildly abnormal" PAP per report in 2015 with negative HPV. Cytology repeated in 2016 was normal. She has not had a period in >1 year. She had a mammogram in 2015, and she needs one this year as well. No history of abnormal findings on mammogram. No history of breast cancer in her family.   -----------------------------------------------------------------   Review of Systems  Constitutional: Negative.   HENT: Negative.   Eyes: Negative.   Respiratory: Negative.   Cardiovascular: Negative.   Gastrointestinal: Negative.   Endocrine: Negative.   Genitourinary: Positive for frequency.  Musculoskeletal: Negative.   Skin: Negative.   Allergic/Immunologic: Positive for environmental allergies.  Neurological: Negative.   Hematological: Negative.   Psychiatric/Behavioral: Negative.     Social History      She  reports that she has never smoked. She has never used smokeless tobacco. She reports that she does not drink alcohol or use drugs.       Social History   Social History  . Marital status: Married    Spouse name: N/A  . Number of children: 2  . Years of education: N/A   Social History Main Topics  . Smoking status: Never Smoker  . Smokeless tobacco: Never Used  . Alcohol use No  . Drug use: No  . Sexual activity: Not Asked   Other Topics Concern  .  None   Social History Narrative   Patient is right handed.   Patient drinks 3 cups of caffeine per day.    Past Medical History:  Diagnosis Date  . Diabetes mellitus without complication (HCC)   . Fibromyalgia   . 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 Hyperlipidemia   . Obesity   . PUD (peptic ulcer disease)   . Varicose vein of leg      Patient Active Problem List   Diagnosis Date Noted  . Acute sinusitis 04/23/2015  . Abnormal ECG 12/15/2014  . Airway hyperreactivity 10/29/2014  . Adult BMI 30+ 10/29/2014  . Cervical disc disease 10/29/2014  . Diabetes mellitus, type  2 (HCC) 10/29/2014  . Allergic rhinitis 10/29/2014  . Gastric ulcer without hemorrhage or perforation 10/29/2014  . Adiposity 10/29/2014  . Pure hypercholesterolemia 10/29/2014  . Fibromyalgia 05/26/2014  . Headache 05/26/2014  . 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 NECK SURGERY  2002, 2005   fusion    Family History        Family Status  Relation Status  . Mother Alive  . Father Alive  . Sister Alive  . Brother Alive  . Brother Alive        Her family history includes Healthy in her brother, brother, father, and sister; Heart attack in her mother; Heart disease in her mother.     No Known Allergies   Current Outpatient Prescriptions:  .  albuterol (PROVENTIL HFA;VENTOLIN HFA) 108 (90 BASE) MCG/ACT inhaler, Inhale 2 puffs into the lungs every 6 (six) hours as needed for wheezing or shortness of breath., Disp: 1 Inhaler, Rfl: 0 .  atorvastatin (LIPITOR) 20 MG tablet, Take 20 mg by mouth daily., Disp: , Rfl:  .  Cholecalciferol 1000 UNITS tablet, Take 2,000 Units by mouth daily. Reported on 04/23/2015, Disp: , Rfl:  .  fluticasone (FLONASE) 50 MCG/ACT nasal spray, Place 2 sprays into both nostrils daily., Disp: 16 g, Rfl: 6 .  glimepiride (AMARYL) 2 MG tablet, Take 2 mg by mouth daily with breakfast., Disp: , Rfl:  .  metFORMIN (GLUCOPHAGE) 500 MG tablet, Take 500 mg by mouth daily with breakfast., Disp: , Rfl:  .  ONE TOUCH ULTRA TEST test strip, , Disp: , Rfl:  .  ONETOUCH DELICA LANCETS 33G MISC, , Disp: , Rfl:  .  sitaGLIPtin (JANUVIA) 100 MG tablet, Take 100 mg by mouth daily., Disp: , Rfl:    Patient Care Team: Lorie PhenixNancy Maloney, MD as PCP - General (Family Medicine)      Objective:   Vitals: BP (!) 166/72 (BP Location: Left Arm, Patient Position: Sitting, Cuff Size: Normal)   Pulse 72   Temp 98 F (36.7 C) (Oral)   Resp 16   Ht 5\' 2"  (1.575 m)   Wt 179 lb (81.2 kg)   BMI 32.74 kg/m    Physical Exam   Constitutional: She is oriented to person, place, and time. She appears well-developed and well-nourished. No distress.  HENT:  Mouth/Throat: Oropharynx is clear and moist. No oropharyngeal exudate.  Eyes: Conjunctivae are normal. Pupils are equal, round, and reactive to light.  Neck: Neck supple. No JVD present. Carotid bruit is not present. No thyromegaly present.  Cardiovascular: Normal rate, regular rhythm and normal heart sounds.   Pulmonary/Chest: Effort normal and breath sounds normal. Right breast exhibits no inverted nipple, no mass, no nipple discharge, no skin change and no tenderness.  Left breast exhibits no inverted nipple, no mass, no nipple discharge, no skin change and no tenderness. Breasts are symmetrical.  Abdominal: Soft. Bowel sounds are normal. She exhibits no distension and no mass. There is no tenderness. There is no rebound and no guarding.  Musculoskeletal: Normal range of motion.  Lymphadenopathy:    She has no cervical adenopathy.  Neurological: She is alert and oriented to person, place, and time.  Skin: Skin is warm and dry. She is not diaphoretic.  Psychiatric: She has a normal mood and affect. Her behavior is normal.     Depression Screen PHQ 2/9 Scores 01/16/2015  PHQ - 2 Score 0      Assessment & Plan:     Routine Health Maintenance and Physical Exam  Exercise Activities and Dietary recommendations Goals    . Exercise 150 minutes per week (moderate activity)       Immunization History  Administered Date(s) Administered  . Influenza-Unspecified 02/05/2015    Health Maintenance  Topic Date Due  . Hepatitis C Screening  January 26, 1961  . PNEUMOCOCCAL POLYSACCHARIDE VACCINE (1) 07/21/1964  . FOOT EXAM  07/21/1972  . OPHTHALMOLOGY EXAM  07/21/1972  . HIV Screening  07/21/1977  . TETANUS/TDAP  07/21/1981  . HEMOGLOBIN A1C  04/04/2015  . URINE MICROALBUMIN  10/02/2015  . INFLUENZA VACCINE  10/06/2015  . MAMMOGRAM  11/13/2015  . PAP SMEAR   01/14/2018  . COLONOSCOPY  05/02/2018     Discussed health benefits of physical activity, and encouraged her to engage in regular exercise appropriate for her age and condition.   Problem List Items Addressed This Visit      Endocrine   Diabetes mellitus, type 2 (HCC)   Relevant Medications   glimepiride (AMARYL) 2 MG tablet   sitaGLIPtin (JANUVIA) 100 MG tablet     Other   Fibromyalgia   Pure hypercholesterolemia    Other Visit Diagnoses    Annual physical exam    -  Primary   Breast cancer screening       Relevant Orders   MM Digital Screening     Annual Physical Exam  Patient counseled diet/exercise. Ordered [facility] today, [facility]. Patient due for pap and colonoscopy in 2020 if continues without symptoms.  Diabetes Mellitus/HLD  Followed by Dr. Tedd SiasSolum, will see her 02/12/2016 and get monitoring labs. Elevated BP today in office. Can be rechecked at endocrine.  Fibromyalgia  Stable.   --------------------------------------------------------------------    Trey SailorsAdriana M Pollak, PA-C  [facility]

## 2016-02-24 ENCOUNTER — Ambulatory Visit (INDEPENDENT_AMBULATORY_CARE_PROVIDER_SITE_OTHER): Payer: Managed Care, Other (non HMO) | Admitting: Physician Assistant

## 2016-02-24 DIAGNOSIS — Z23 Encounter for immunization: Secondary | ICD-10-CM

## 2016-02-24 DIAGNOSIS — Z794 Long term (current) use of insulin: Secondary | ICD-10-CM | POA: Diagnosis not present

## 2016-02-24 DIAGNOSIS — E118 Type 2 diabetes mellitus with unspecified complications: Secondary | ICD-10-CM

## 2016-02-24 NOTE — Progress Notes (Signed)
Patient here for Tdap and pneumo

## 2016-03-28 ENCOUNTER — Ambulatory Visit (INDEPENDENT_AMBULATORY_CARE_PROVIDER_SITE_OTHER): Payer: Managed Care, Other (non HMO) | Admitting: Physician Assistant

## 2016-03-28 ENCOUNTER — Encounter: Payer: Self-pay | Admitting: Physician Assistant

## 2016-03-28 VITALS — BP 122/80 | HR 72 | Temp 98.3°F | Resp 16 | Wt 176.0 lb

## 2016-03-28 DIAGNOSIS — J069 Acute upper respiratory infection, unspecified: Secondary | ICD-10-CM | POA: Diagnosis not present

## 2016-03-28 MED ORDER — PROMETHAZINE-DM 6.25-15 MG/5ML PO SYRP: 5.0000 mL | ORAL_SOLUTION | Freq: Every evening | ORAL | 0 refills | Status: DC | PRN

## 2016-03-28 NOTE — Progress Notes (Signed)
[facility]  Chief Complaint  Patient presents with  . URI    Subjective:    Patient ID: Carrie James, female    DOB: [birth year], 56 y.o.   MRN: [id number]  Upper Respiratory Infection: Carrie James is a 56 y.o. female with a past medical history significant for Allergies, DMII complaining of symptoms of a URI. Symptoms include left ear pain, congestion, cough and sore throat. Onset of symptoms was 3 days ago, unchanged since that time. She also c/o congestion, nasal congestion and post nasal drip for the past 3 days .  She is drinking plenty of fluids. Evaluation to date: none. Treatment to date: cough suppressants and decongestants. The treatment has provided minimal relief. Wants to be sure she doesn't have the flu.  Review of Systems  Constitutional: Positive for fatigue. Negative for activity change, appetite change, chills, diaphoresis, fever and unexpected weight change.  HENT: Positive for congestion, ear pain, rhinorrhea, sinus pain, sinus pressure, sneezing and sore throat. Negative for ear discharge, hearing loss, nosebleeds and postnasal drip.   Eyes: Positive for discharge and redness. Negative for photophobia, pain, itching and visual disturbance.  Respiratory: Positive for cough, chest tightness and shortness of breath. Negative for apnea, wheezing and stridor.   Gastrointestinal: Positive for abdominal pain. Negative for abdominal distention, anal bleeding, blood in stool, constipation, diarrhea, nausea, rectal pain and vomiting.  Musculoskeletal: Positive for back pain.  Neurological: Positive for light-headedness and headaches. Negative for dizziness.       Objective:   BP 122/80 (BP Location: Left Arm, Patient Position: Sitting, Cuff Size: Normal)   Pulse 72   Temp 98.3 F (36.8 C) (Oral)   Resp 16   Wt 176 lb (79.8 kg)   SpO2 97%   BMI 32.19 kg/m   Patient Active Problem List   Diagnosis Date Noted  . Acute sinusitis  04/23/2015  . Abnormal ECG 12/15/2014  . Airway hyperreactivity 10/29/2014  . Adult BMI 30+ 10/29/2014  . Cervical disc disease 10/29/2014  . Diabetes mellitus, type 2 (HCC) 10/29/2014  . Allergic rhinitis 10/29/2014  . Gastric ulcer without hemorrhage or perforation 10/29/2014  . Adiposity 10/29/2014  . Pure hypercholesterolemia 10/29/2014  . Fibromyalgia 05/26/2014  . Headache 05/26/2014  . 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 albuterol (PROVENTIL HFA;VENTOLIN HFA) 108 (90 BASE) MCG/ACT inhaler Inhale 2 puffs into the lungs every 6 (six) hours as needed for wheezing or shortness of breath.   Marland Kitchen atorvastatin (LIPITOR) 20 MG tablet Take 20 mg by mouth daily. 05/26/2014: Received from: [facility] Received Sig:   . Cholecalciferol 1000 UNITS tablet Take 2,000 Units by mouth daily. Reported on 04/23/2015 05/26/2014: Received from: [facility] Received Sig:   . fluticasone (FLONASE) 50 MCG/ACT nasal spray Place 2 sprays into both nostrils daily.   Marland Kitchen glimepiride (AMARYL) 2 MG tablet Take 2 mg by mouth daily with breakfast.   . metFORMIN (GLUCOPHAGE) 500 MG tablet Take 500 mg by mouth daily with breakfast.   . ONE TOUCH ULTRA TEST test strip  01/16/2015: Received from: External Pharmacy  . ONETOUCH DELICA LANCETS 33G MISC  01/16/2015: Received from: External Pharmacy  . promethazine-dextromethorphan (PROMETHAZINE-DM) 6.25-15 MG/5ML syrup Take 5 mLs by mouth at bedtime as needed for cough.   . sitaGLIPtin (JANUVIA) 100 MG tablet Take 100 mg by mouth daily.    No facility-administered encounter medications on  file as of 03/28/2016.     No Known Allergies     Physical Exam  Constitutional: She appears well-developed and well-nourished. No distress.  HENT:  Mouth/Throat: Oropharynx is clear and moist. No oropharyngeal exudate.  Tms opaque bilaterally.  Neck: Neck supple.   Cardiovascular: Normal rate and regular rhythm.   Pulmonary/Chest: Effort normal and breath sounds normal.  Lymphadenopathy:    She has no cervical adenopathy.  Neurological: She is alert.  Skin: Skin is warm and dry.       Assessment & Plan:   1. Upper respiratory tract infection, unspecified type  Patient presenting with URI, appears viral sinusitis right now. Counseled patient that if she feels like shes getting better but then worsens with regards to congestion or if congestion persists > 10 days, she may call us back for antibiotic. Until now, symptomatic treatment and cough suppressant as needed. Counseled on sedation.   - promethazine-dextromethorphan (PROMETHAZINE-DM) 6.25-15 MG/5ML syrup; Take 5 mLs by mouth at bedtime as needed for cough.  Dispense: 118 mL; Refill: 0  Recommend rest, fluids, frequent hand washing. Work note provided.  Return if symptoms worsen or fail to improve.   Patient Instructions  Upper Respiratory Infection, Adult Most upper respiratory infections (URIs) are caused by a virus. A URI affects the nose, throat, and upper air passages. The most common type of URI is often called "the common cold." Follow these instructions at home:  Take medicines only as told by your doctor.  Gargle warm saltwater or take cough drops to comfort your throat as told by your doctor.  Use a warm mist humidifier or inhale steam from a shower to increase air moisture. This may make it easier to breathe.  Drink enough fluid to keep your pee (urine) clear or pale yellow.  Eat soups and other clear broths.  Have a healthy diet.  Rest as needed.  Go back to work when your fever is gone or your doctor says it is okay.  You may need to stay home longer to avoid giving your URI to others.  You can also wear a face mask and wash your hands often to prevent spread of the virus.  Use your inhaler more if you have asthma.  Do not use any tobacco products, including  cigarettes, chewing tobacco, or electronic cigarettes. If you need help quitting, ask your doctor. Contact a doctor if:  You are getting worse, not better.  Your symptoms are not helped by medicine.  You have chills.  You are getting more short of breath.  You have brown or red mucus.  You have yellow or brown discharge from your nose.  You have pain in your face, especially when you bend forward.  You have a fever.  You have puffy (swollen) neck glands.  You have pain while swallowing.  You have white areas in the back of your throat. Get help right away if:  You have very bad or constant:  Headache.  Ear pain.  Pain in your forehead, behind your eyes, and over your cheekbones (sinus pain).  Chest pain.  You have long-lasting (chronic) lung disease and any of the following:  Wheezing.  Long-lasting cough.  Coughing up blood.  A change in your usual mucus.  You have a stiff neck.  You have changes in your:  Vision.  Hearing.  Thinking.  Mood. This information is not intended to replace advice given to you by your health care provider. Make sure you discuss any questions  you have with your health care provider. Document Released: 08/10/2007 Document Revised: 10/25/2015 Document Reviewed: 05/29/2013 Elsevier Interactive Patient Education  2017 [employer].     The entirety of the information documented in the History of Present Illness, Review of Systems and Physical Exam were personally obtained by me. Portions of this information were initially documented by Kavin Leech, CMA and reviewed by me for thoroughness and accuracy.

## 2016-03-28 NOTE — Patient Instructions (Signed)
Upper Respiratory Infection, Adult Most upper respiratory infections (URIs) are caused by a virus. A URI affects the nose, throat, and upper air passages. The most common type of URI is often called "the common cold." Follow these instructions at home:  Take medicines only as told by your doctor.  Gargle warm saltwater or take cough drops to comfort your throat as told by your doctor.  Use a warm mist humidifier or inhale steam from a shower to increase air moisture. This may make it easier to breathe.  Drink enough fluid to keep your pee (urine) clear or pale yellow.  Eat soups and other clear broths.  Have a healthy diet.  Rest as needed.  Go back to work when your fever is gone or your doctor says it is okay.  You may need to stay home longer to avoid giving your URI to others.  You can also wear a face mask and wash your hands often to prevent spread of the virus.  Use your inhaler more if you have asthma.  Do not use any tobacco products, including cigarettes, chewing tobacco, or electronic cigarettes. If you need help quitting, ask your doctor. Contact a doctor if:  You are getting worse, not better.  Your symptoms are not helped by medicine.  You have chills.  You are getting more short of breath.  You have brown or red mucus.  You have yellow or brown discharge from your nose.  You have pain in your face, especially when you bend forward.  You have a fever.  You have puffy (swollen) neck glands.  You have pain while swallowing.  You have white areas in the back of your throat. Get help right away if:  You have very bad or constant:  Headache.  Ear pain.  Pain in your forehead, behind your eyes, and over your cheekbones (sinus pain).  Chest pain.  You have long-lasting (chronic) lung disease and any of the following:  Wheezing.  Long-lasting cough.  Coughing up blood.  A change in your usual mucus.  You have a stiff neck.  You have  changes in your:  Vision.  Hearing.  Thinking.  Mood. This information is not intended to replace advice given to you by your health care provider. Make sure you discuss any questions you have with your health care provider. Document Released: 08/10/2007 Document Revised: 10/25/2015 Document Reviewed: 05/29/2013 Elsevier Interactive Patient Education  2017 Elsevier Inc.  

## 2016-05-25 ENCOUNTER — Ambulatory Visit
Admission: RE | Admit: 2016-05-25 | Discharge: 2016-05-25 | Disposition: A | Discharge status: Home / Self Care | Payer: Managed Care, Other (non HMO) | Source: Ambulatory Visit | Attending: Physician Assistant | Admitting: Physician Assistant

## 2016-05-25 DIAGNOSIS — Z1239 Encounter for other screening for malignant neoplasm of breast: Secondary | ICD-10-CM

## 2016-05-25 DIAGNOSIS — Z1231 Encounter for screening mammogram for malignant neoplasm of breast: Secondary | ICD-10-CM | POA: Insufficient documentation

## 2016-08-22 LAB — HEMOGLOBIN A1C: HEMOGLOBIN A1C: 7.6

## 2016-08-22 LAB — MICROALBUMIN, URINE: MICROALB UR: 8.2

## 2016-10-05 ENCOUNTER — Encounter: Payer: Self-pay | Admitting: Family Medicine

## 2017-03-01 ENCOUNTER — Encounter: Payer: Self-pay | Admitting: Physician Assistant

## 2017-03-01 ENCOUNTER — Ambulatory Visit: Payer: Managed Care, Other (non HMO) | Admitting: Physician Assistant

## 2017-03-01 ENCOUNTER — Ambulatory Visit (INDEPENDENT_AMBULATORY_CARE_PROVIDER_SITE_OTHER): Payer: Managed Care, Other (non HMO) | Admitting: Physician Assistant

## 2017-03-01 VITALS — BP 116/72 | HR 66 | Temp 97.7°F | Wt 170.8 lb

## 2017-03-01 DIAGNOSIS — J40 Bronchitis, not specified as acute or chronic: Secondary | ICD-10-CM

## 2017-03-01 DIAGNOSIS — J4531 Mild persistent asthma with (acute) exacerbation: Secondary | ICD-10-CM

## 2017-03-01 MED ORDER — ALBUTEROL SULFATE HFA 108 (90 BASE) MCG/ACT IN AERS: 1.0000 | INHALATION_SPRAY | Freq: Four times a day (QID) | RESPIRATORY_TRACT | 5 refills | Status: AC | PRN

## 2017-03-01 MED ORDER — AMOXICILLIN-POT CLAVULANATE 875-125 MG PO TABS: 1.0000 | ORAL_TABLET | Freq: Two times a day (BID) | ORAL | 0 refills | Status: DC

## 2017-03-01 NOTE — Progress Notes (Signed)
Patient: Carrie BumpersHema D James Female    DOB: 08-30-1960   56 y.o.   MRN: [id number] Visit Date: 03/01/2017  Today's Provider: Margaretann LovelessJennifer M Burnette, PA-C   Chief Complaint  Patient presents with  . URI   Subjective:    URI   This is a new problem. Episode onset: Thursday  The problem has been gradually worsening. There has been no fever. Associated symptoms include congestion, coughing, ear pain, headaches and wheezing. Pertinent negatives include no abdominal pain, chest pain, nausea, sinus pain, sneezing or sore throat. Associated symptoms comments: Dizziness . Treatments tried: Mucinex DM. The treatment provided mild relief.      No Known Allergies   Current Outpatient Medications:  .  albuterol (PROVENTIL HFA;VENTOLIN HFA) 108 (90 BASE) MCG/ACT inhaler, Inhale 2 puffs into the lungs every 6 (six) hours as needed for wheezing or shortness of breath., Disp: 1 Inhaler, Rfl: 0 .  atorvastatin (LIPITOR) 20 MG tablet, Take 20 mg by mouth daily., Disp: , Rfl:  .  Cholecalciferol 1000 UNITS tablet, Take 2,000 Units by mouth daily. Reported on 04/23/2015, Disp: , Rfl:  .  fluticasone (FLONASE) 50 MCG/ACT nasal spray, Place 2 sprays into both nostrils daily., Disp: 16 g, Rfl: 6 .  glimepiride (AMARYL) 2 MG tablet, Take 2 mg by mouth daily with breakfast., Disp: , Rfl:  .  insulin glargine (LANTUS) 100 UNIT/ML injection, Inject 14 Units into the skin at bedtime., Disp: , Rfl:  .  ONE TOUCH ULTRA TEST test strip, , Disp: , Rfl:  .  ONETOUCH DELICA LANCETS 33G MISC, , Disp: , Rfl:  .  metFORMIN (GLUCOPHAGE) 500 MG tablet, Take 500 mg by mouth daily with breakfast., Disp: , Rfl:  .  sitaGLIPtin (JANUVIA) 100 MG tablet, Take 100 mg by mouth daily., Disp: , Rfl:   Review of Systems  Constitutional: Positive for fatigue. Negative for fever.  HENT: Positive for congestion, ear pain, postnasal drip and sinus pressure. Negative for sinus pain, sneezing, sore throat and trouble swallowing.     Respiratory: Positive for cough, chest tightness, shortness of breath and wheezing.   Cardiovascular: Negative for chest pain, palpitations and leg swelling.  Gastrointestinal: Negative for abdominal pain and nausea.  Neurological: Positive for dizziness and headaches. Negative for light-headedness.    Social History   Tobacco Use  . Smoking status: Never Smoker  . Smokeless tobacco: Never Used  Substance Use Topics  . Alcohol use: No    Alcohol/week: 0.0 oz   Objective:   BP 116/72 (BP Location: Left Arm, Patient Position: Sitting, Cuff Size: Normal)   Pulse 66   Temp 97.7 F (36.5 C) (Oral)   Wt 170 lb 12.8 oz (77.5 kg)   SpO2 98%   BMI 31.24 kg/m    Physical Exam  Constitutional: She appears well-developed and well-nourished. No distress.  HENT:  Head: Normocephalic and atraumatic.  Right Ear: Hearing, tympanic membrane, external ear and ear canal normal.  Left Ear: Hearing, tympanic membrane, external ear and ear canal normal.  Nose: Nose normal.  Mouth/Throat: Uvula is midline, oropharynx is clear and moist and mucous membranes are normal. No oropharyngeal exudate.  Eyes: Conjunctivae are normal. Pupils are equal, round, and reactive to light. Right eye exhibits no discharge. Left eye exhibits no discharge. No scleral icterus.  Neck: Normal range of motion. Neck supple. No tracheal deviation present. No thyromegaly present.  Cardiovascular: Normal rate, regular rhythm and normal heart sounds. Exam reveals no gallop and  no friction rub.  No murmur heard. Pulmonary/Chest: Effort normal. No stridor. No respiratory distress. She has wheezes (expiratory wheezes throughout). She has no rales.  Lymphadenopathy:    She has no cervical adenopathy.  Skin: Skin is warm and dry. She is not diaphoretic.  Vitals reviewed.       Assessment & Plan:     1. Bronchitis Worsening symptoms that have not responded to OTC medications. Will give augmentin as below. Continue allergy  medications. Stay well hydrated and get plenty of rest. Call if no symptom improvement or if symptoms worsen. - amoxicillin-clavulanate (AUGMENTIN) 875-125 MG tablet; Take 1 tablet by mouth 2 (two) times daily.  Dispense: 20 tablet; Refill: 0  2. Mild persistent asthma with acute exacerbation Will give albuterol as below for wheezing.  - albuterol (PROVENTIL HFA;VENTOLIN HFA) 108 (90 Base) MCG/ACT inhaler; Inhale 1-2 puffs into the lungs every 6 (six) hours as needed for wheezing or shortness of breath.  Dispense: 1 Inhaler; Refill: 5       Margaretann LovelessJennifer M Burnette, PA-C  [facility]

## 2017-03-01 NOTE — Patient Instructions (Signed)

## 2017-03-13 ENCOUNTER — Telehealth: Payer: Self-pay | Admitting: Physician Assistant

## 2017-03-13 DIAGNOSIS — J40 Bronchitis, not specified as acute or chronic: Secondary | ICD-10-CM

## 2017-03-13 MED ORDER — FLUTICASONE FUROATE-VILANTEROL 100-25 MCG/INH IN AEPB: 1.0000 | INHALATION_SPRAY | Freq: Every day | RESPIRATORY_TRACT | 0 refills | Status: AC

## 2017-03-13 NOTE — Telephone Encounter (Signed)
Pt advised and agrees with treatment plan. 

## 2017-03-13 NOTE — Telephone Encounter (Signed)
LMTCB  Thanks,  -Sylvia Kondracki 

## 2017-03-13 NOTE — Telephone Encounter (Signed)
Patient states that she is feeling a lot better.  Still having cough, chest congestion and headache.  She will be going out of town on Saturday and would like to know if she could get another antibiotic [facility].  She states that you told her to call if she is not better.

## 2017-03-13 NOTE — Telephone Encounter (Signed)
May send in another inhaler for her to try. It will be once daily. Continue albuterol every 4-6 hrs prn for cough/sob. Call if symptoms are not improving by Thursday so I can send in something else before she leaves.

## 2017-03-17 ENCOUNTER — Telehealth: Payer: Self-pay | Admitting: Physician Assistant

## 2017-03-17 DIAGNOSIS — J069 Acute upper respiratory infection, unspecified: Secondary | ICD-10-CM

## 2017-03-17 MED ORDER — DOXYCYCLINE HYCLATE 100 MG PO TABS: 100.0000 mg | ORAL_TABLET | Freq: Two times a day (BID) | ORAL | 0 refills | Status: DC

## 2017-03-17 NOTE — Telephone Encounter (Signed)
Pt stated that she is still coughing, has a sore throat, and runny nose (nasal discharge is clear). Pt is asking if she should continue with the plan that was discussed earlier this week or if she should try something else. Please advise. Thanks TNP

## 2017-03-17 NOTE — Telephone Encounter (Signed)
Doxycycline sent to [facility] 

## 2017-03-17 NOTE — Telephone Encounter (Signed)
Pt advised-Anastasiya V Hopkins, RMA  

## 2017-03-17 NOTE — Telephone Encounter (Signed)
lmtcb-Anastasiya V Hopkins, RMA  

## 2017-03-17 NOTE — Telephone Encounter (Signed)
Please review-Carrie James, RMA  

## 2017-03-17 NOTE — Telephone Encounter (Signed)
Patient is returning your call and requesting a call back. °

## 2017-07-11 ENCOUNTER — Other Ambulatory Visit: Payer: Self-pay | Admitting: Physician Assistant

## 2017-08-23 ENCOUNTER — Encounter: Payer: Self-pay | Admitting: Physician Assistant

## 2017-08-23 ENCOUNTER — Other Ambulatory Visit: Payer: Self-pay

## 2017-08-23 ENCOUNTER — Ambulatory Visit (INDEPENDENT_AMBULATORY_CARE_PROVIDER_SITE_OTHER): Payer: Managed Care, Other (non HMO) | Admitting: Physician Assistant

## 2017-08-23 VITALS — BP 120/70 | HR 54 | Temp 97.8°F | Resp 16 | Ht 62.0 in | Wt 171.0 lb

## 2017-08-23 DIAGNOSIS — Z1231 Encounter for screening mammogram for malignant neoplasm of breast: Secondary | ICD-10-CM | POA: Diagnosis not present

## 2017-08-23 DIAGNOSIS — Z6831 Body mass index (BMI) 31.0-31.9, adult: Secondary | ICD-10-CM

## 2017-08-23 DIAGNOSIS — Z124 Encounter for screening for malignant neoplasm of cervix: Secondary | ICD-10-CM | POA: Diagnosis not present

## 2017-08-23 DIAGNOSIS — Z1159 Encounter for screening for other viral diseases: Secondary | ICD-10-CM | POA: Diagnosis not present

## 2017-08-23 DIAGNOSIS — E7800 Pure hypercholesterolemia, unspecified: Secondary | ICD-10-CM | POA: Diagnosis not present

## 2017-08-23 DIAGNOSIS — Z114 Encounter for screening for human immunodeficiency virus [HIV]: Secondary | ICD-10-CM | POA: Diagnosis not present

## 2017-08-23 DIAGNOSIS — E6609 Other obesity due to excess calories: Secondary | ICD-10-CM | POA: Diagnosis not present

## 2017-08-23 DIAGNOSIS — E039 Hypothyroidism, unspecified: Secondary | ICD-10-CM | POA: Diagnosis not present

## 2017-08-23 DIAGNOSIS — Z1239 Encounter for other screening for malignant neoplasm of breast: Secondary | ICD-10-CM

## 2017-08-23 DIAGNOSIS — Z0000 Encounter for general adult medical examination without abnormal findings: Secondary | ICD-10-CM

## 2017-08-23 DIAGNOSIS — E118 Type 2 diabetes mellitus with unspecified complications: Secondary | ICD-10-CM | POA: Diagnosis not present

## 2017-08-23 DIAGNOSIS — R350 Frequency of micturition: Secondary | ICD-10-CM | POA: Diagnosis not present

## 2017-08-23 DIAGNOSIS — Z794 Long term (current) use of insulin: Secondary | ICD-10-CM

## 2017-08-23 LAB — POCT URINALYSIS DIPSTICK
APPEARANCE: NORMAL
Bilirubin, UA: NEGATIVE
Glucose, UA: NEGATIVE
Ketones, UA: NEGATIVE
LEUKOCYTES UA: NEGATIVE
NITRITE UA: NEGATIVE
PROTEIN UA: NEGATIVE
Spec Grav, UA: 1.01 (ref 1.010–1.025)
Urobilinogen, UA: 0.2 E.U./dL
pH, UA: 6 (ref 5.0–8.0)

## 2017-08-23 LAB — POCT UA - MICROALBUMIN: Microalbumin Ur, POC: 20 mg/L

## 2017-08-23 NOTE — Patient Instructions (Signed)
Health Maintenance for Postmenopausal Women Menopause is a normal process in which your reproductive ability comes to an end. This process happens gradually over a span of months to years, usually between the ages of 22 and 9. Menopause is complete when you have missed 12 consecutive menstrual periods. It is important to talk with your health care provider about some of the most common conditions that affect postmenopausal women, such as heart disease, cancer, and bone loss (osteoporosis). Adopting a healthy lifestyle and getting preventive care can help to promote your health and wellness. Those actions can also lower your chances of developing some of these common conditions. What should I know about menopause? During menopause, you may experience a number of symptoms, such as:  Moderate-to-severe hot flashes.  Night sweats.  Decrease in sex drive.  Mood swings.  Headaches.  Tiredness.  Irritability.  Memory problems.  Insomnia.  Choosing to treat or not to treat menopausal changes is an individual decision that you make with your health care provider. What should I know about hormone replacement therapy and supplements? Hormone therapy products are effective for treating symptoms that are associated with menopause, such as hot flashes and night sweats. Hormone replacement carries certain risks, especially as you become older. If you are thinking about using estrogen or estrogen with progestin treatments, discuss the benefits and risks with your health care provider. What should I know about heart disease and stroke? Heart disease, heart attack, and stroke become more likely as you age. This may be due, in part, to the hormonal changes that your body experiences during menopause. These can affect how your body processes dietary fats, triglycerides, and cholesterol. Heart attack and stroke are both medical emergencies. There are many things that you can do to help prevent heart disease  and stroke:  Have your blood pressure checked at least every 1-2 years. High blood pressure causes heart disease and increases the risk of stroke.  If you are 53-22 years old, ask your health care provider if you should take aspirin to prevent a heart attack or a stroke.  Do not use any tobacco products, including cigarettes, chewing tobacco, or electronic cigarettes. If you need help quitting, ask your health care provider.  It is important to eat a healthy diet and maintain a healthy weight. ? Be sure to include plenty of vegetables, fruits, low-fat dairy products, and lean protein. ? Avoid eating foods that are high in solid fats, added sugars, or salt (sodium).  Get regular exercise. This is one of the most important things that you can do for your health. ? Try to exercise for at least 150 minutes each week. The type of exercise that you do should increase your heart rate and make you sweat. This is known as moderate-intensity exercise. ? Try to do strengthening exercises at least twice each week. Do these in addition to the moderate-intensity exercise.  Know your numbers.Ask your health care provider to check your cholesterol and your blood glucose. Continue to have your blood tested as directed by your health care provider.  What should I know about cancer screening? There are several types of cancer. Take the following steps to reduce your risk and to catch any cancer development as early as possible. Breast Cancer  Practice breast self-awareness. ? This means understanding how your breasts normally appear and feel. ? It also means doing regular breast self-exams. Let your health care provider know about any changes, no matter how small.  If you are 40  or older, have a clinician do a breast exam (clinical breast exam or CBE) every year. Depending on your age, family history, and medical history, it may be recommended that you also have a yearly breast X-ray (mammogram).  If you  have a family history of breast cancer, talk with your health care provider about genetic screening.  If you are at high risk for breast cancer, talk with your health care provider about having an MRI and a mammogram every year.  Breast cancer (BRCA) gene test is recommended for women who have family members with BRCA-related cancers. Results of the assessment will determine the need for genetic counseling and BRCA1 and for BRCA2 testing. BRCA-related cancers include these types: ? Breast. This occurs in males or females. ? Ovarian. ? Tubal. This may also be called fallopian tube cancer. ? Cancer of the abdominal or pelvic lining (peritoneal cancer). ? Prostate. ? Pancreatic.  Cervical, Uterine, and Ovarian Cancer Your health care provider may recommend that you be screened regularly for cancer of the pelvic organs. These include your ovaries, uterus, and vagina. This screening involves a pelvic exam, which includes checking for microscopic changes to the surface of your cervix (Pap test).  For women ages 21-65, health care providers may recommend a pelvic exam and a Pap test every three years. For women ages 79-65, they may recommend the Pap test and pelvic exam, combined with testing for human papilloma virus (HPV), every five years. Some types of HPV increase your risk of cervical cancer. Testing for HPV may also be done on women of any age who have unclear Pap test results.  Other health care providers may not recommend any screening for nonpregnant women who are considered low risk for pelvic cancer and have no symptoms. Ask your health care provider if a screening pelvic exam is right for you.  If you have had past treatment for cervical cancer or a condition that could lead to cancer, you need Pap tests and screening for cancer for at least 20 years after your treatment. If Pap tests have been discontinued for you, your risk factors (such as having a new sexual partner) need to be  reassessed to determine if you should start having screenings again. Some women have medical problems that increase the chance of getting cervical cancer. In these cases, your health care provider may recommend that you have screening and Pap tests more often.  If you have a family history of uterine cancer or ovarian cancer, talk with your health care provider about genetic screening.  If you have vaginal bleeding after reaching menopause, tell your health care provider.  There are currently no reliable tests available to screen for ovarian cancer.  Lung Cancer Lung cancer screening is recommended for adults 69-62 years old who are at high risk for lung cancer because of a history of smoking. A yearly low-dose CT scan of the lungs is recommended if you:  Currently smoke.  Have a history of at least 30 pack-years of smoking and you currently smoke or have quit within the past 15 years. A pack-year is smoking an average of one pack of cigarettes per day for one year.  Yearly screening should:  Continue until it has been 15 years since you quit.  Stop if you develop a health problem that would prevent you from having lung cancer treatment.  Colorectal Cancer  This type of cancer can be detected and can often be prevented.  Routine colorectal cancer screening usually begins at  age 42 and continues through age 45.  If you have risk factors for colon cancer, your health care provider may recommend that you be screened at an earlier age.  If you have a family history of colorectal cancer, talk with your health care provider about genetic screening.  Your health care provider may also recommend using home test kits to check for hidden blood in your stool.  A small camera at the end of a tube can be used to examine your colon directly (sigmoidoscopy or colonoscopy). This is done to check for the earliest forms of colorectal cancer.  Direct examination of the colon should be repeated every  5-10 years until age 71. However, if early forms of precancerous polyps or small growths are found or if you have a family history or genetic risk for colorectal cancer, you may need to be screened more often.  Skin Cancer  Check your skin from head to toe regularly.  Monitor any moles. Be sure to tell your health care provider: ? About any new moles or changes in moles, especially if there is a change in a mole's shape or color. ? If you have a mole that is larger than the size of a pencil eraser.  If any of your family members has a history of skin cancer, especially at a young age, talk with your health care provider about genetic screening.  Always use sunscreen. Apply sunscreen liberally and repeatedly throughout the day.  Whenever you are outside, protect yourself by wearing long sleeves, pants, a wide-brimmed hat, and sunglasses.  What should I know about osteoporosis? Osteoporosis is a condition in which bone destruction happens more quickly than new bone creation. After menopause, you may be at an increased risk for osteoporosis. To help prevent osteoporosis or the bone fractures that can happen because of osteoporosis, the following is recommended:  If you are 46-71 years old, get at least 1,000 mg of calcium and at least 600 mg of vitamin D per day.  If you are older than age 55 but younger than age 65, get at least 1,200 mg of calcium and at least 600 mg of vitamin D per day.  If you are older than age 54, get at least 1,200 mg of calcium and at least 800 mg of vitamin D per day.  Smoking and excessive alcohol intake increase the risk of osteoporosis. Eat foods that are rich in calcium and vitamin D, and do weight-bearing exercises several times each week as directed by your health care provider. What should I know about how menopause affects my mental health? Depression may occur at any age, but it is more common as you become older. Common symptoms of depression  include:  Low or sad mood.  Changes in sleep patterns.  Changes in appetite or eating patterns.  Feeling an overall lack of motivation or enjoyment of activities that you previously enjoyed.  Frequent crying spells.  Talk with your health care provider if you think that you are experiencing depression. What should I know about immunizations? It is important that you get and maintain your immunizations. These include:  Tetanus, diphtheria, and pertussis (Tdap) booster vaccine.  Influenza every year before the flu season begins.  Pneumonia vaccine.  Shingles vaccine.  Your health care provider may also recommend other immunizations. This information is not intended to replace advice given to you by your health care provider. Make sure you discuss any questions you have with your health care provider. Document Released: 04/15/2005  Document Revised: 09/11/2015 Document Reviewed: 11/25/2014 [employer] Interactive Patient Education  2018 [employer]  

## 2017-08-23 NOTE — Progress Notes (Signed)
Patient: Carrie James, Female    DOB: 1960-11-28, 57 y.o.   MRN: [id number] Visit Date: 08/23/2017  Today's Provider: Margaretann Loveless, PA-C   Chief Complaint  Patient presents with  . Annual Exam   Subjective:    Annual physical exam Carrie James is a 57 y.o. female who presents today for health maintenance and complete physical. She feels well. She reports exercising daily walking. She reports she is sleeping well.  02/09/16 CPE 12/15/13 Pap-abnormal, HPV-neg 05/26/16 Mammogram-BI-RADS 1 05/02/08 colonoscopy-normal -----------------------------------------------------------------   Review of Systems  Constitutional: Negative.   HENT: Positive for dental problem and ear pain.   Eyes: Positive for itching.  Respiratory: Positive for chest tightness.   Cardiovascular: Negative.   Gastrointestinal: Negative.   Endocrine: Negative.   Genitourinary: Positive for frequency and urgency.  Musculoskeletal: Positive for back pain, joint swelling and neck pain.  Skin: Negative.   Allergic/Immunologic: Positive for environmental allergies.  Neurological: Positive for numbness.  Hematological: Negative.   Psychiatric/Behavioral: Negative.     Social History      She  reports that she has never smoked. She has never used smokeless tobacco. She reports that she does not drink alcohol or use drugs.       Social History   Socioeconomic History  . Marital status: Married    Spouse name: Not on file  . Number of children: 2  . Years of education: Not on file  . Highest education level: Not on file  Occupational History  . Not on file  Social Needs  . Financial resource strain: Not on file  . Food insecurity:    Worry: Not on file    Inability: Not on file  . Transportation needs:    Medical: Not on file    Non-medical: Not on file  Tobacco Use  . Smoking status: Never Smoker  . Smokeless tobacco: Never Used  Substance and Sexual Activity  . Alcohol use: No   Alcohol/week: 0.0 oz  . Drug use: No  . Sexual activity: Not on file  Lifestyle  . Physical activity:    Days per week: Not on file    Minutes per session: Not on file  . Stress: Not on file  Relationships  . Social connections:    Talks on phone: Not on file    Gets together: Not on file    Attends religious service: Not on file    Active member of club or organization: Not on file    Attends meetings of clubs or organizations: Not on file    Relationship status: Not on file  Other Topics Concern  . Not on file  Social History Narrative   Patient is right handed.   Patient drinks 3 cups of caffeine per day.    Past Medical History:  Diagnosis Date  . Diabetes mellitus without complication (HCC)   . Fibromyalgia   . 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 Hyperlipidemia   . Obesity   . PUD (peptic ulcer disease)   . Varicose vein of leg      Patient Active Problem List   Diagnosis Date Noted  . Abnormal ECG 12/15/2014  . Uncomplicated asthma 10/29/2014  . Adult BMI 30+ 10/29/2014  . Cervical disc disorder 10/29/2014  . Type 2 diabetes mellitus with complication, with long-term current use of insulin (HCC) 10/29/2014  . Allergic rhinitis 10/29/2014  . Gastric ulcer without hemorrhage or perforation 10/29/2014  .  Other obesity 10/29/2014  . Pure hypercholesterolemia 10/29/2014  . Headache 05/26/2014  . Adhesive capsulitis of shoulder 05/26/2014    Past Surgical History:  Procedure Laterality Date  . NECK SURGERY  2002, 2005   fusion    Family History        Family Status  Relation Name Status  . Mother  Alive  . Father  Alive  . Sister  Alive  . Brother  Alive  . Brother  Alive  . Neg Hx  (Not Specified)        Her family history includes Asthma in her father; Healthy in her brother, brother, father, and sister; Heart attack in her mother; Heart disease in her mother. There is no history of Breast cancer.      No Known Allergies   Current  Outpatient Medications:  .  albuterol (PROVENTIL HFA;VENTOLIN HFA) 108 (90 Base) MCG/ACT inhaler, Inhale 1-2 puffs into the lungs every 6 (six) hours as needed for wheezing or shortness of breath., Disp: 1 Inhaler, Rfl: 5 .  atorvastatin (LIPITOR) 20 MG tablet, Take 20 mg by mouth daily., Disp: , Rfl:  .  Cholecalciferol 1000 UNITS tablet, Take 2,000 Units by mouth daily. Reported on 04/23/2015, Disp: , Rfl:  .  fluticasone (FLONASE) 50 MCG/ACT nasal spray, Place 2 sprays into both nostrils daily., Disp: 16 g, Rfl: 6 .  fluticasone furoate-vilanterol (BREO ELLIPTA) 100-25 MCG/INH AEPB, Inhale 1 puff into the lungs daily. (Patient taking differently: Inhale 1 puff into the lungs as needed. ), Disp: 28 each, Rfl: 0 .  glimepiride (AMARYL) 2 MG tablet, Take 2 mg by mouth daily with breakfast., Disp: , Rfl:  .  insulin glargine (LANTUS) 100 UNIT/ML injection, Inject 14 Units into the skin at bedtime., Disp: , Rfl:  .  levothyroxine (SYNTHROID, LEVOTHROID) 50 MCG tablet, TAKE 1 TABLET BY MOUTH ONCE DAILY ON AN EMPTY STOMACH  WITH A GLASS OF WATER AT  LEAST 30-60 MINUTES BEFORE  BREAKFAST, Disp: , Rfl:  .  metFORMIN (GLUCOPHAGE) 500 MG tablet, Take 500 mg by mouth 2 (two) times daily with a meal. , Disp: , Rfl:  .  ONE TOUCH ULTRA TEST test strip, , Disp: , Rfl:  .  ONETOUCH DELICA LANCETS 33G MISC, , Disp: , Rfl:    Patient Care Team: Maryella Shivers as PCP - General (Physician Assistant)      Objective:   Vitals: BP 120/70 (BP Location: Left Arm, Patient Position: Sitting, Cuff Size: Normal)   Pulse (!) 54   Temp 97.8 F (36.6 C) (Oral)   Resp 16   Ht 5\' 2"  (1.575 m)   Wt 171 lb (77.6 kg)   SpO2 99%   BMI 31.28 kg/m    Vitals:   08/23/17 1419  BP: 120/70  Pulse: (!) 54  Resp: 16  Temp: 97.8 F (36.6 C)  TempSrc: Oral  SpO2: 99%  Weight: 171 lb (77.6 kg)  Height: 5\' 2"  (1.575 m)     Physical Exam  Constitutional: She is oriented to person, place, and time. She appears  well-developed and well-nourished. No distress.  HENT:  Head: Normocephalic and atraumatic.  Right Ear: Hearing, tympanic membrane, external ear and ear canal normal.  Left Ear: Hearing, tympanic membrane, external ear and ear canal normal.  Nose: Nose normal.  Mouth/Throat: Uvula is midline, oropharynx is clear and moist and mucous membranes are normal. No oropharyngeal exudate.  Eyes: Pupils are equal, round, and reactive to light. Conjunctivae and EOM  are normal. Right eye exhibits no discharge. Left eye exhibits no discharge. No scleral icterus.  Neck: Normal range of motion. Neck supple. No JVD present. Carotid bruit is not present. No tracheal deviation present. No thyromegaly present.  Cardiovascular: Normal rate, regular rhythm, normal heart sounds and intact distal pulses. Exam reveals no gallop and no friction rub.  No murmur heard. Pulses:      Dorsalis pedis pulses are 2+ on the right side, and 2+ on the left side.       Posterior tibial pulses are 2+ on the right side, and 2+ on the left side.  Pulmonary/Chest: Effort normal and breath sounds normal. No respiratory distress. She has no wheezes. She has no rales. She exhibits no tenderness. Right breast exhibits no inverted nipple, no mass, no nipple discharge, no skin change and no tenderness. Left breast exhibits no inverted nipple, no mass, no nipple discharge, no skin change and no tenderness. No breast tenderness, discharge or bleeding. Breasts are symmetrical.  Abdominal: Soft. Bowel sounds are normal. She exhibits no distension and no mass. There is no tenderness. There is no rebound and no guarding. Hernia confirmed negative in the right inguinal area and confirmed negative in the left inguinal area.  Genitourinary: Rectum normal, vagina normal and uterus normal. No breast tenderness, discharge or bleeding. Pelvic exam was performed with patient supine. There is no rash, tenderness, lesion or injury on the right labia. There is no  rash, tenderness, lesion or injury on the left labia. Cervix exhibits no motion tenderness, no discharge and no friability. Right adnexum displays no mass, no tenderness and no fullness. Left adnexum displays no mass, no tenderness and no fullness. No erythema, tenderness or bleeding in the vagina. No signs of injury around the vagina. No vaginal discharge found.  Musculoskeletal: Normal range of motion. She exhibits no edema or tenderness.       Right foot: There is normal range of motion and no deformity.       Left foot: There is normal range of motion and no deformity.  Feet:  Right Foot:  Protective Sensation: 10 sites tested. 10 sites sensed.  Skin Integrity: Negative for ulcer, skin breakdown, erythema or warmth.  Left Foot:  Protective Sensation: 10 sites tested. 10 sites sensed.  Skin Integrity: Negative for ulcer, skin breakdown, erythema or warmth.  Lymphadenopathy:    She has no cervical adenopathy.       Right: No inguinal adenopathy present.       Left: No inguinal adenopathy present.  Neurological: She is alert and oriented to person, place, and time. She has normal reflexes. No cranial nerve deficit. Coordination normal.  Skin: Skin is warm and dry. No rash noted. She is not diaphoretic.  Psychiatric: She has a normal mood and affect. Her behavior is normal. Judgment and thought content normal.  Vitals reviewed.    Depression Screen PHQ 2/9 Scores 08/23/2017 03/01/2017 01/16/2015  PHQ - 2 Score 0 0 0  PHQ- 9 Score 0 - -      Assessment & Plan:     Routine Health Maintenance and Physical Exam  Exercise Activities and Dietary recommendations Goals    . Exercise 150 minutes per week (moderate activity)       Immunization History  Administered Date(s) Administered  . Influenza-Unspecified 02/05/2015, 12/15/2016  . Pneumococcal Polysaccharide-23 02/24/2016  . Tdap 02/24/2016    Health Maintenance  Topic Date Due  . Hepatitis C Screening  1960-08-10  . FOOT  EXAM  07/21/1972  . OPHTHALMOLOGY EXAM  07/21/1972  . HIV Screening  07/21/1977  . HEMOGLOBIN A1C  02/21/2017  . URINE MICROALBUMIN  08/22/2017  . INFLUENZA VACCINE  10/05/2017  . PAP SMEAR  01/14/2018  . COLONOSCOPY  05/02/2018  . MAMMOGRAM  05/26/2018  . PNEUMOCOCCAL POLYSACCHARIDE VACCINE (2) 02/23/2021  . TETANUS/TDAP  02/23/2026     Discussed health benefits of physical activity, and encouraged her to engage in regular exercise appropriate for her age and condition.   1. Annual physical exam Normal physical exam today. Will check labs as below and f/u pending lab results. If labs are stable and WNL she will not need to have these rechecked for one year at her next annual physical exam. She is to call the office in the meantime if she has any acute issue, questions or concerns.  2. Breast cancer screening Breast exam today was normal. There is no family history of breast cancer. She does perform regular self breast exams. Mammogram was ordered as below. Information for [facility] was given to patient so she may schedule her mammogram at her convenience. - MM DIGITAL SCREENING BILATERAL; Future  3. Cervical cancer screening Pap collected today. Will send as below and f/u pending results. - Pap IG, CT/NG NAA, and HPV (high risk)  4. Pure hypercholesterolemia Stable. Continue atorvastatin 20mg  daily. Will check labs as below and f/u pending results. - CBC w/Diff/Platelet - Comprehensive Metabolic Panel (CMET) - Lipid Profile  5. Type 2 diabetes mellitus with complication, with long-term current use of insulin (HCC) Stable. Last A1c was 7.4% on 05/24/17. Microalbumin today was 20. Followed by Endocrinology, Dr. Tedd SiasSolum.  - CBC w/Diff/Platelet - Comprehensive Metabolic Panel (CMET) - Lipid Profile - POCT UA - Microalbumin  6. Hypothyroidism, unspecified type Stable. Continue levothyroxine 50mcg. Followed by Dr. Tedd SiasSolum.  - TSH  7. Class 1 obesity due to excess  calories with serious comorbidity and body mass index (BMI) of 31.0 to 31.9 in adult Counseled patient on healthy lifestyle modifications including dieting and exercise.  - CBC w/Diff/Platelet - Comprehensive Metabolic Panel (CMET) - Lipid Profile  8. Need for hepatitis C screening test - Hepatitis C Antibody  9. Screening for HIV without presence of risk factors - HIV antibody (with reflex)  10. Urine frequency UA normal.  - POCT Urinalysis Dipstick   --------------------------------------------------------------------    Carrie LovelessJennifer M Eboney Claybrook, PA-C  [facility]

## 2017-08-26 LAB — LIPID PANEL
CHOL/HDL RATIO: 2.8 ratio (ref 0.0–4.4)
Cholesterol, Total: 119 mg/dL (ref 100–199)
HDL: 42 mg/dL (ref >39)
LDL CALC: 56 mg/dL (ref 0–99)
Triglycerides: 107 mg/dL (ref 0–149)
VLDL CHOLESTEROL CAL: 21 mg/dL (ref 5–40)

## 2017-08-26 LAB — CBC WITH DIFFERENTIAL/PLATELET
BASOS ABS: 0 10*3/uL (ref 0.0–0.2)
BASOS: 0 %
EOS (ABSOLUTE): 0.1 10*3/uL (ref 0.0–0.4)
Eos: 2 %
Hematocrit: 34.1 % (ref 34.0–46.6)
Hemoglobin: 11.3 g/dL (ref 11.1–15.9)
IMMATURE GRANS (ABS): 0 10*3/uL (ref 0.0–0.1)
Immature Granulocytes: 0 %
LYMPHS: 40 %
Lymphocytes Absolute: 2.8 10*3/uL (ref 0.7–3.1)
MCH: 27.4 pg (ref 26.6–33.0)
MCHC: 33.1 g/dL (ref 31.5–35.7)
MCV: 83 fL (ref 79–97)
MONOCYTES: 5 %
Monocytes Absolute: 0.3 10*3/uL (ref 0.1–0.9)
NEUTROS ABS: 3.7 10*3/uL (ref 1.4–7.0)
Neutrophils: 53 %
Platelets: 297 10*3/uL (ref 150–450)
RBC: 4.12 x10E6/uL (ref 3.77–5.28)
RDW: 14.1 % (ref 12.3–15.4)
WBC: 6.9 10*3/uL (ref 3.4–10.8)

## 2017-08-26 LAB — COMPREHENSIVE METABOLIC PANEL
ALBUMIN: 4 g/dL (ref 3.5–5.5)
ALT: 17 IU/L (ref 0–32)
AST: 14 IU/L (ref 0–40)
Albumin/Globulin Ratio: 1.7 (ref 1.2–2.2)
Alkaline Phosphatase: 73 IU/L (ref 39–117)
BUN / CREAT RATIO: 22 (ref 9–23)
BUN: 12 mg/dL (ref 6–24)
CHLORIDE: 108 mmol/L — AB (ref 96–106)
CO2: 19 mmol/L — ABNORMAL LOW (ref 20–29)
Calcium: 8.8 mg/dL (ref 8.7–10.2)
Creatinine, Ser: 0.54 mg/dL — ABNORMAL LOW (ref 0.57–1.00)
GFR, EST AFRICAN AMERICAN: 123 mL/min/{1.73_m2} (ref >59)
GFR, EST NON AFRICAN AMERICAN: 107 mL/min/{1.73_m2} (ref >59)
GLUCOSE: 101 mg/dL — AB (ref 65–99)
Globulin, Total: 2.3 g/dL (ref 1.5–4.5)
Potassium: 4 mmol/L (ref 3.5–5.2)
Sodium: 141 mmol/L (ref 134–144)
TOTAL PROTEIN: 6.3 g/dL (ref 6.0–8.5)

## 2017-08-26 LAB — HEPATITIS C ANTIBODY: Hep C Virus Ab: <0.1 s/co ratio (ref 0.0–0.9)

## 2017-08-26 LAB — TSH: TSH: 2.39 u[IU]/mL (ref 0.450–4.500)

## 2017-08-26 LAB — HIV ANTIBODY (ROUTINE TESTING W REFLEX): HIV Screen 4th Generation wRfx: NONREACTIVE

## 2017-08-29 LAB — PAP IG, CT-NG NAA, HPV HIGH-RISK
CHLAMYDIA, NUC. ACID AMP: NEGATIVE
Gonococcus by Nucleic Acid Amp: NEGATIVE
HPV, high-risk: NEGATIVE
PAP Smear Comment: 0

## 2017-08-30 ENCOUNTER — Telehealth: Payer: Self-pay

## 2017-08-30 NOTE — Telephone Encounter (Signed)
LMTCB

## 2017-08-30 NOTE — Telephone Encounter (Signed)
-----   Message from Margaretann LovelessJennifer M Burnette, [place]PA-C sent at 08/30/2017 10:05 AM EDT ----- All labs are within normal limits and stable.  Pap is normal, HPV negative.  Will repeat in 3-5 years.Thanks! -JB

## 2017-08-30 NOTE — Telephone Encounter (Signed)
Patient was notified of results. Expressed understanding. Patient wanted to know if lab results can be mailed to her? Please advise?

## 2017-09-18 ENCOUNTER — Encounter: Payer: Self-pay | Admitting: General Surgery

## 2017-09-21 ENCOUNTER — Encounter: Payer: Self-pay | Admitting: General Surgery

## 2017-09-27 ENCOUNTER — Ambulatory Visit
Admission: RE | Admit: 2017-09-27 | Discharge: 2017-09-27 | Disposition: A | Discharge status: Home / Self Care | Payer: Managed Care, Other (non HMO) | Source: Ambulatory Visit | Attending: Physician Assistant | Admitting: Physician Assistant

## 2017-09-27 DIAGNOSIS — Z1239 Encounter for other screening for malignant neoplasm of breast: Secondary | ICD-10-CM

## 2017-09-27 DIAGNOSIS — Z1231 Encounter for screening mammogram for malignant neoplasm of breast: Secondary | ICD-10-CM | POA: Insufficient documentation

## 2017-09-28 ENCOUNTER — Telehealth: Payer: Self-pay

## 2017-09-28 NOTE — Telephone Encounter (Signed)
-----   Message from Margaretann LovelessJennifer M Burnette, [place]-C sent at 09/28/2017  9:04 AM EDT ----- Normal mammogram. Repeat screening in one year.

## 2017-09-28 NOTE — Telephone Encounter (Signed)
Patient advised as directed below.  Thanks,  -Joseline 

## 2018-01-29 DIAGNOSIS — Z0289 Encounter for other administrative examinations: Secondary | ICD-10-CM

## 2018-01-30 ENCOUNTER — Telehealth: Payer: Self-pay | Admitting: *Deleted

## 2018-01-30 NOTE — Telephone Encounter (Signed)
Patient called back, spoke with phone staff and accepted follow up with Dr Anne HahnWillis, Dec 16th. She was advised to arrive 30 minutes early to check in. She had been instructed previously to bring current insurance cards and medication list.

## 2018-01-30 NOTE — Telephone Encounter (Signed)
Noted/fim 

## 2018-01-30 NOTE — Telephone Encounter (Addendum)
Received call from patient who stated she has a chronic condition with her spine. She stated she will come in to be seen if necessary to have accomodation forms filled out. This RN advised it may be a while before an appointment is available, however will let Dr Anne HahnWillis and Faith RN know. Advised she will get a call back regarding an appointment.  Checked Dr Anne HahnWillis' schedule, called patient back and LVM advising her of 7:30 am FU, Dec 16th, Dec 17th. Requested she call back to let this RN know if she can come to either appointment.

## 2018-01-30 NOTE — Telephone Encounter (Signed)
Received paperwork from patient re: [employer] Accomodation Medical Assessment form. The patient was last seen in this office on 09/25/14. Called and LVM advising her this office will be unable to fill out these forms because she has not been seen since 09/25/14. Advised she contact her PCP to assist her in this matter. Informed her the papers will be mailed back to her. Left office number.  Forms sent to medical records to be mailed back to patient.  Note sent to Horald PollenAngie Atkins, billing to reimburse payment to patient.

## 2018-02-19 ENCOUNTER — Encounter: Payer: Self-pay | Admitting: Neurology

## 2018-02-19 ENCOUNTER — Ambulatory Visit (INDEPENDENT_AMBULATORY_CARE_PROVIDER_SITE_OTHER): Payer: Managed Care, Other (non HMO) | Admitting: Neurology

## 2018-02-19 VITALS — BP 121/75 | HR 61 | Ht 62.0 in | Wt 162.0 lb

## 2018-02-19 DIAGNOSIS — M797 Fibromyalgia: Secondary | ICD-10-CM

## 2018-02-19 NOTE — Progress Notes (Signed)
Reason for visit: Fibromyalgia, cervical spondylosis  Carrie James is a 57 y.o. female  History of present illness:  Carrie James is a 56 year old right-handed [place] female with a history of fibromyalgia and diabetes.  The patient was seen over 3 years ago with complaints of neck pain, cervicogenic headache, and problems with limitation of movement of the left shoulder.  The patient was sent for physical therapy but did not improve and eventually was sent for an orthopedic referral.  The patient apparently was sent for further physical therapy and gradually improved the left shoulder mobility.  The patient began having similar problems on the right that also responded to physical therapy, and she also had some discomfort from arthritis of the right thumb that responded to physical therapy.  The patient is on no medications for fibromyalgia.  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  Currently, she works at a desk in front of a computer.  Her desk is not wide enough to accommodate her computer, her rolladex, and her phone, she has to rotate to the left in order to access the phone in her rolladex.  The patient does try to stretch on a regular basis, she will walk on a daily basis which seemed to help her discomfort.  She does have some pain between the shoulder blades and some pain down the spine.  She denies any pain going down the arms or the legs.  She is on no medications for arthritis.  She returns to this office for an evaluation, she is hoping to have some accommodations at work that would help her spine pain.  She tries to maintain a straight upright position which helps her neck and low back pain.  Past Medical History:  Diagnosis Date  . Diabetes mellitus without complication (HCC)   . Fibromyalgia   . 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 Hyperlipidemia   . Obesity   . PUD (peptic ulcer disease)   . Varicose vein of leg     Past Surgical History:  Procedure  Laterality Date  . NECK SURGERY  2002, 2005   fusion    Family History  Problem Relation Age of Onset  . Heart attack Mother   . Heart disease Mother   . Healthy Father   . Asthma Father   . Healthy Sister   . Healthy Brother   . Healthy Brother   . Breast cancer Neg Hx     Social history:  reports that she has never smoked. She has never used smokeless tobacco. She reports that she does not drink alcohol or use drugs.  Medications:  Prior to Admission medications   Medication Sig Start Date End Date Taking? Authorizing Provider  albuterol (PROVENTIL HFA;VENTOLIN HFA) 108 (90 Base) MCG/ACT inhaler Inhale 1-2 puffs into the lungs every 6 (six) hours as needed for wheezing or shortness of breath. 03/01/17  Yes Margaretann Loveless, PA-C  atorvastatin (LIPITOR) 20 MG tablet Take 20 mg by mouth daily. 01/09/14  Yes [provider]  canagliflozin (INVOKANA) 300 MG TABS tablet Take 1 tablet by mouth daily. 01/03/18 02/19/18 Yes [provider]  Cholecalciferol 1000 UNITS tablet Take 2,000 Units by mouth daily. Reported on 04/23/2015   Yes [provider]  fluticasone (FLONASE) 50 MCG/ACT nasal spray Place 2 sprays into both nostrils daily. 05/11/15  Yes Lorie Phenix, MD  fluticasone furoate-vilanterol (BREO ELLIPTA) 100-25 MCG/INH AEPB Inhale 1 puff into the lungs daily. Patient  taking differently: Inhale 1 puff into the lungs as needed.  03/13/17  Yes Margaretann Loveless, PA-C  insulin glargine (LANTUS) 100 UNIT/ML injection Inject 14 Units into the skin at bedtime.   Yes [provider]  levothyroxine (SYNTHROID, LEVOTHROID) 50 MCG tablet TAKE 1 TABLET BY MOUTH ONCE DAILY ON AN EMPTY STOMACH  WITH A GLASS OF WATER AT  LEAST 30-60 MINUTES BEFORE  BREAKFAST 05/05/17  Yes [provider]  metFORMIN (GLUCOPHAGE) 500 MG tablet Take 500 mg by mouth 2 (two) times daily with a meal.    Yes [provider]  ONE TOUCH ULTRA TEST test strip  12/22/14   Yes [provider]  Dola Argyle LANCETS 33G MISC  12/22/14  Yes [provider]     No Known Allergies  ROS:  Out of a complete 14 system review of symptoms, the patient complains only of the following symptoms, and all other reviewed systems are negative.  Joint pain, back pain, neck pain, neck stiffness  Blood pressure 121/75, pulse 61, height 5\' 2"  (1.575 m), weight 162 lb (73.5 kg).  Physical Exam  General: The patient is alert and cooperative at the time of the examination.  Eyes: Pupils are equal, round, and reactive to light. Discs are flat bilaterally.  Neck: The neck is supple, no carotid bruits are noted.  Respiratory: The respiratory examination is clear.  Cardiovascular: The cardiovascular examination reveals a regular rate and rhythm, no obvious murmurs or rubs are noted.  Neuromuscular: The patient lacks about 20 degrees of full lateral rotation of the cervical spine bilaterally.  She is able to flex the low back to about 110 degrees.  Skin: Extremities are without significant edema.  Neurologic Exam  Mental status: The patient is alert and oriented x 3 at the time of the examination. The patient has apparent normal recent and remote memory, with an apparently normal attention span and concentration ability.  Cranial nerves: Facial symmetry is present. There is good sensation of the face to pinprick and soft touch bilaterally. The strength of the facial muscles and the muscles to head turning and shoulder shrug are normal bilaterally. Speech is well enunciated, no aphasia or dysarthria is noted. Extraocular movements are full. Visual fields are full. The tongue is midline, and the patient has symmetric elevation of the soft palate. No obvious hearing deficits are noted.  Motor: The motor testing reveals 5 over 5 strength of all 4 extremities. Good symmetric motor tone is noted throughout.  Sensory: Sensory testing is intact to pinprick, soft  touch, vibration sensation, and position sense on all 4 extremities. No evidence of extinction is noted.  Coordination: Cerebellar testing reveals good finger-nose-finger and heel-to-shin bilaterally.  Gait and station: Gait is normal. Tandem gait is normal. Romberg is negative. No drift is seen.  Reflexes: Deep tendon reflexes are symmetric and normal bilaterally. Toes are downgoing bilaterally.   Assessment/Plan:  1.  Fibromyalgia  2.  Cervical spondylosis, status post surgery  3.  Degenerative arthritis  4.  Bilateral frozen shoulders, resolved  The patient is doing fairly well at this time with her fibromyalgia pain, she has to stretch on a regular basis.  The patient is on no medication for arthritis or for the fibromyalgia.  I have recommended that she stretch frequently for a few minutes each time while at work at as her work is sedentary.  Accommodations with a larger desk or an L-shaped desk may be of some help to prevent constant rotational  movement to the left which worsens her pain.  The patient appears to be functioning well if she is able to stretch on a regular basis and exercise.  She will return to this office on an as-needed basis.  Marlan Palau. Keith  MD 02/19/2018 7:37 AM  [facility] [address] [place], [place] [id number]  Phone [phone] Fax [phone]

## 2018-02-20 ENCOUNTER — Telehealth: Payer: Self-pay | Admitting: *Deleted

## 2018-02-20 NOTE — Telephone Encounter (Signed)
Accomodation forms from [employer] completed by Dr. Anne HahnWillis and fwd back to medical records. Copy of completed forms made and filled . MB RN

## 2018-02-20 NOTE — Telephone Encounter (Signed)
Pt [employer] form @ front desk for p/u

## 2018-03-19 NOTE — Telephone Encounter (Signed)
Patient called and stated that this form needs to be udpated. Please call and advise.

## 2018-03-19 NOTE — Telephone Encounter (Signed)
I contacted the pt. She states she is in need of updated forms for the 2020 year. Pt states the forms have been sent twice, but I have not received. Pt was provided with the office fax number to have forms re sent [phone]). Pt was agreeable and had no further questions/concerns at this time.

## 2018-03-22 ENCOUNTER — Telehealth: Payer: Self-pay | Admitting: *Deleted

## 2018-03-22 NOTE — Telephone Encounter (Signed)
[employer] form on [employer] desk.

## 2018-03-23 NOTE — Telephone Encounter (Signed)
Dr. Anne Hahn reviewed/completed [employer] form (additonal information regarding sit/stand desk) Forms fwd back to medical records for processing.

## 2018-04-12 ENCOUNTER — Telehealth: Payer: Self-pay | Admitting: *Deleted

## 2018-04-12 NOTE — Telephone Encounter (Signed)
I have made the addendum to [employer] form stating pt needs a angular L-shaped desk that goes up/down.   Dr. Stann Mainland had agreed to the L-shaped desk when originally completed, I added the part about the desk needing the capability to raise/lower.

## 2018-04-12 NOTE — Telephone Encounter (Signed)
Pt called need  information added to her [employer] form. Please call [phone]

## 2018-04-12 NOTE — Telephone Encounter (Signed)
Pt has called back to inform that an angular or L Shaped desk is needed that goes up and down is needed

## 2018-04-12 NOTE — Telephone Encounter (Signed)
I requested a call back from the pt to further discuss information to be added.

## 2018-04-16 ENCOUNTER — Telehealth: Payer: Self-pay | Admitting: *Deleted

## 2018-04-16 NOTE — Telephone Encounter (Signed)
I refax pt [employer] on 04/13/18.

## 2018-11-03 IMAGING — MG MM DIGITAL SCREENING BILAT W/ TOMO W/ CAD
7 series · 8 of 19 positions shown · non-contrast
Comparison: Previous exam(s).

CLINICAL DATA: Screening.

EXAM:
DIGITAL SCREENING BILATERAL MAMMOGRAM WITH TOMO AND CAD

[R CC synth-2D]
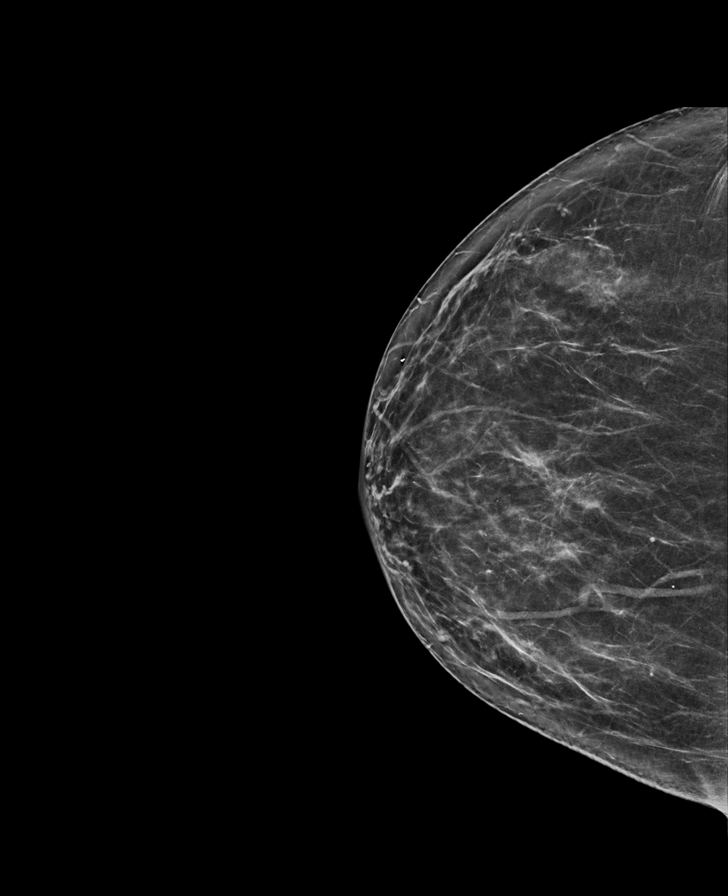

[R MLO synth-2D]
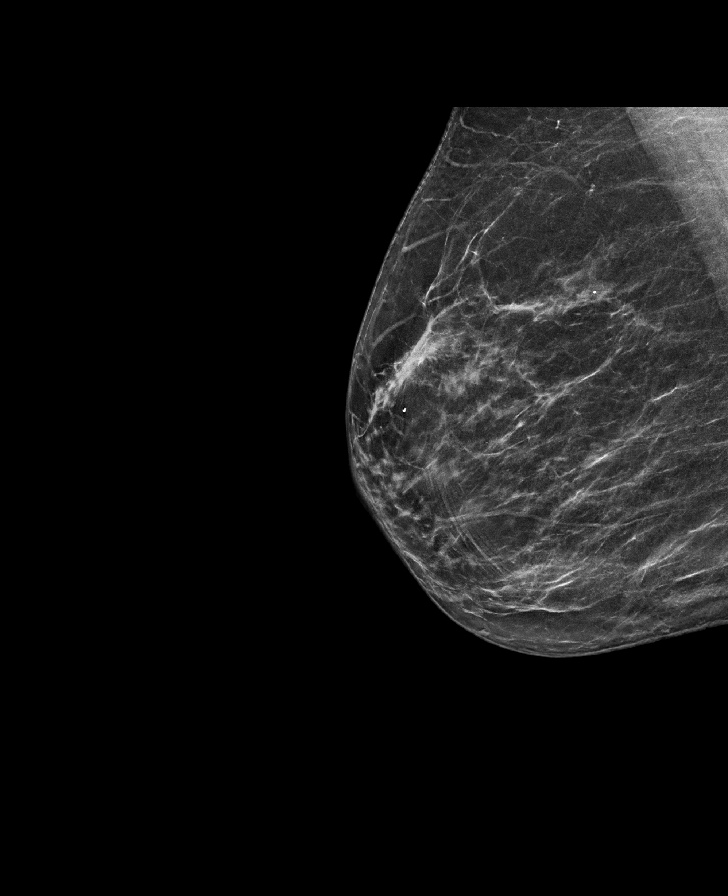

[L MLO synth-2D]
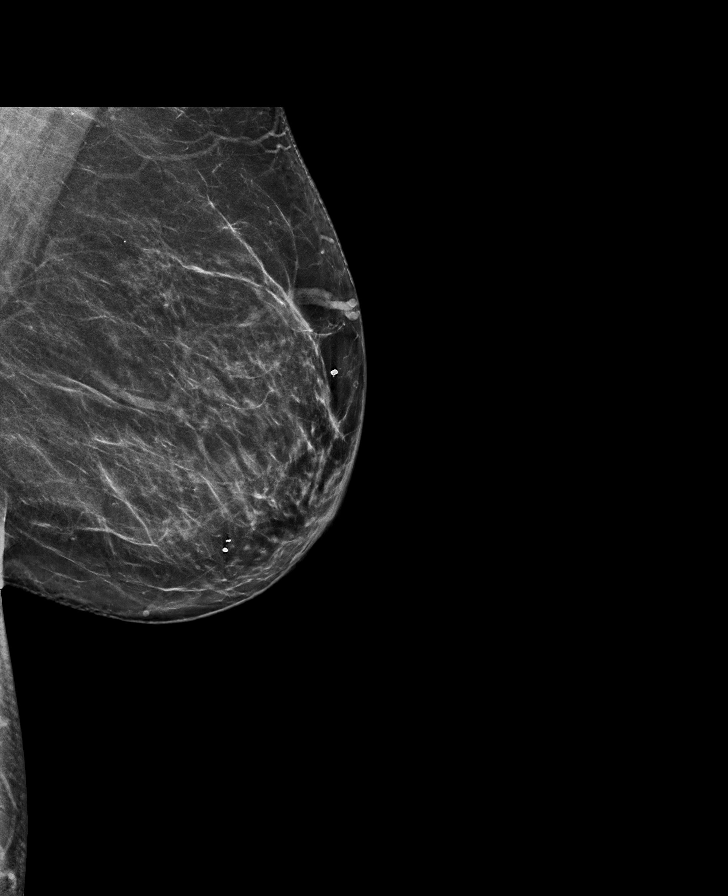

[L CC synth-2D]
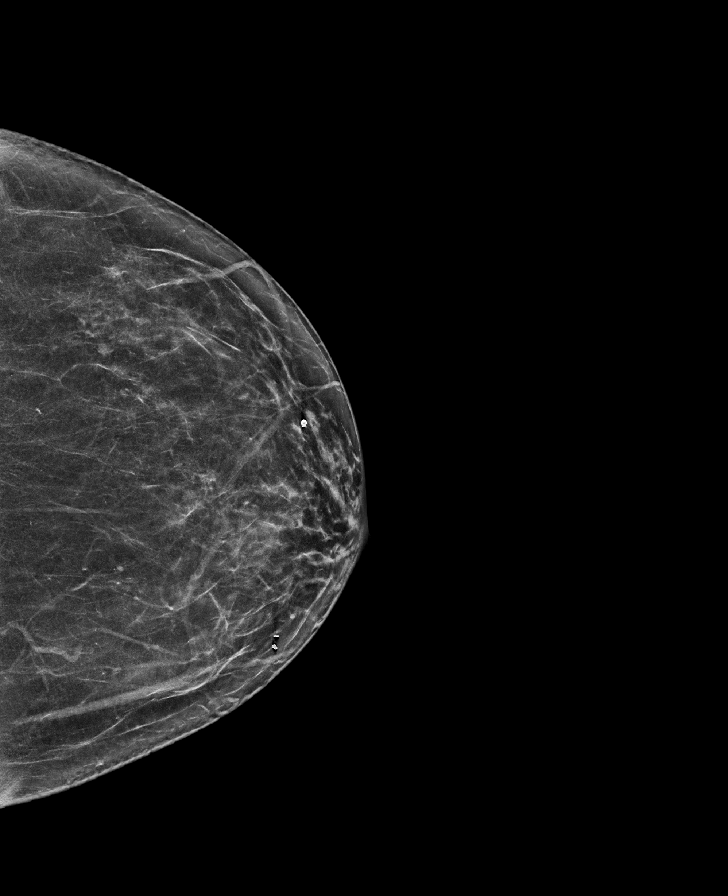

[L CC tomo · 2 of 69 frames shown]
[frame 23/69]
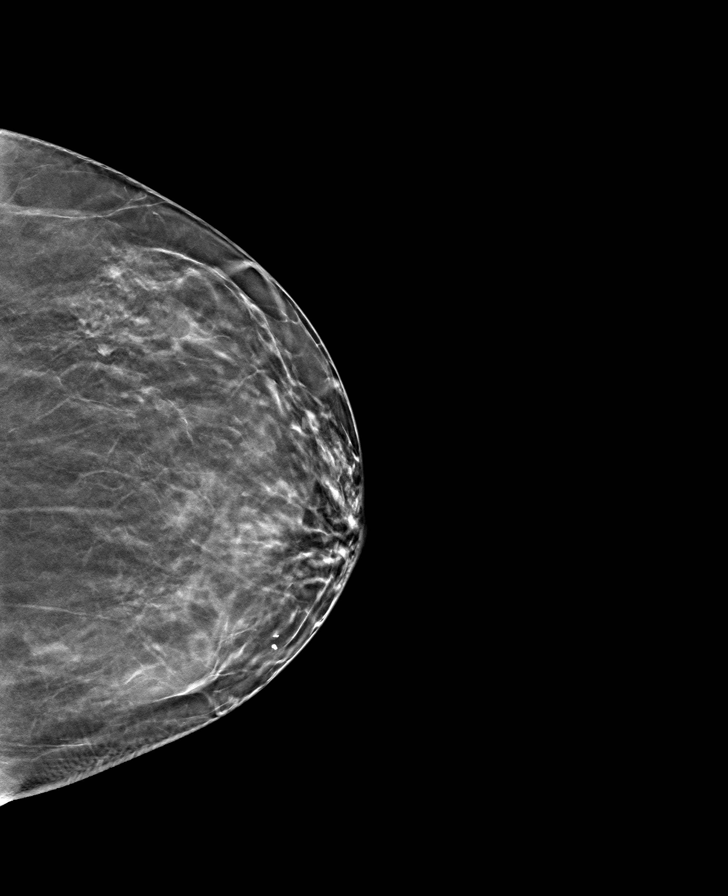
[frame 35/69]
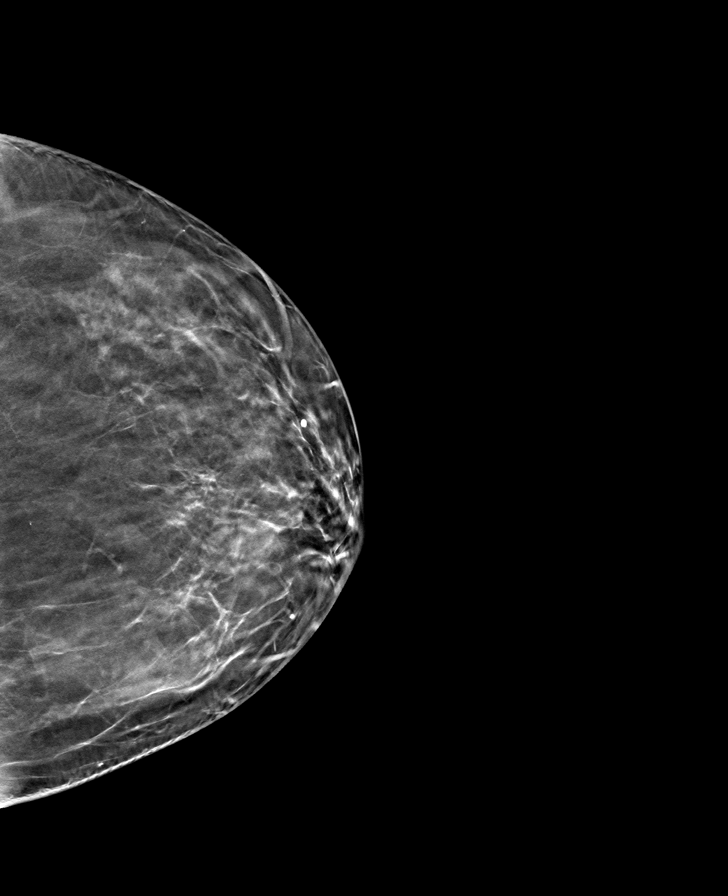

[R CC tomo · tomo slice 36/71.0]
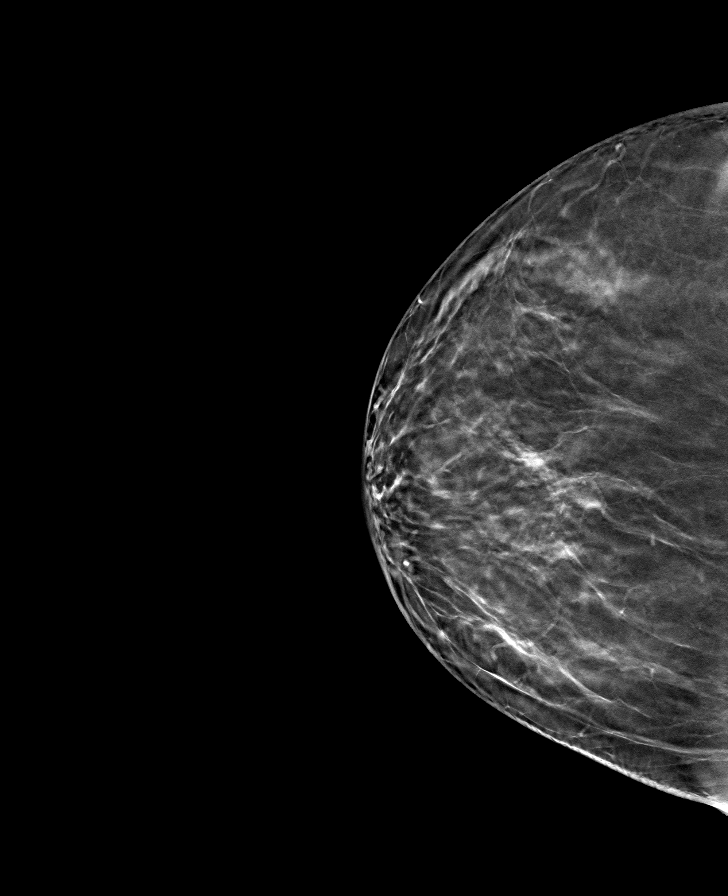

[L MLO tomo · tomo slice 39/78.0]
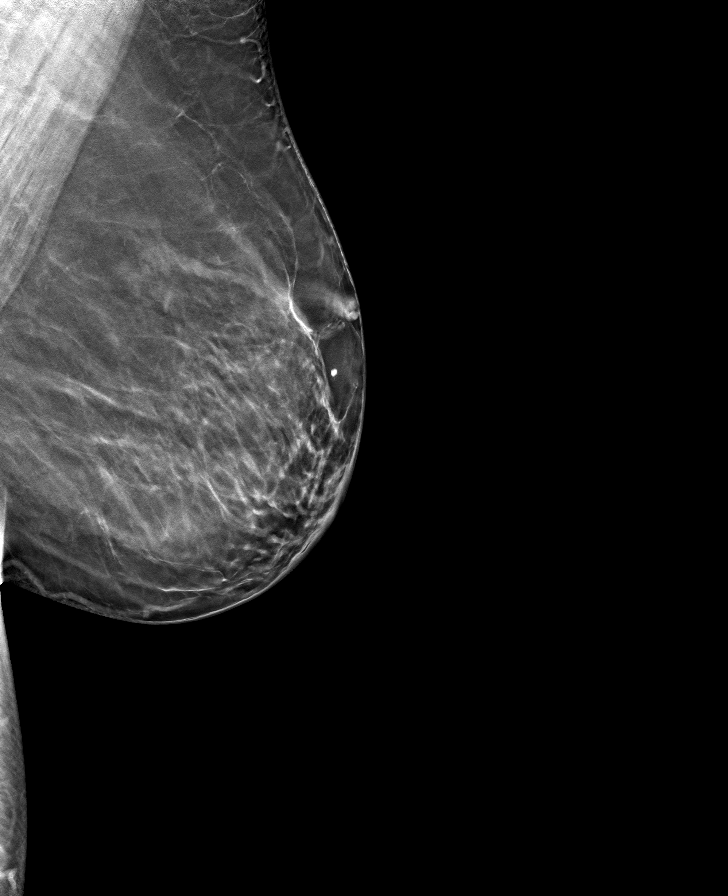

[8 of 19 positions shown; findings below may reference images not displayed]

ACR Breast Density Category b: There are scattered areas of
fibroglandular density.
FINDINGS: There are no findings suspicious for malignancy. Images were
processed with CAD.
IMPRESSION: No mammographic evidence of malignancy. A result letter of this
screening mammogram will be mailed directly to the patient.

RECOMMENDATION:
Screening mammogram in one year. (Code:[id number])

BI-RADS CATEGORY  1: Negative.

## 2019-10-16 ENCOUNTER — Other Ambulatory Visit: Payer: Self-pay

## 2019-10-16 ENCOUNTER — Ambulatory Visit (INDEPENDENT_AMBULATORY_CARE_PROVIDER_SITE_OTHER): Payer: Managed Care, Other (non HMO) | Admitting: Dermatology

## 2019-10-16 DIAGNOSIS — L309 Dermatitis, unspecified: Secondary | ICD-10-CM

## 2019-10-16 DIAGNOSIS — L03011 Cellulitis of right finger: Secondary | ICD-10-CM

## 2019-10-16 MED ORDER — CLOTRIMAZOLE-BETAMETHASONE 1-0.05 % EX CREA: TOPICAL_CREAM | CUTANEOUS | 1 refills | Status: AC

## 2019-10-16 NOTE — Progress Notes (Signed)
   Follow-Up Visit   Subjective  Carrie James is a 59 y.o. female who presents for the following: Nail Problem (right middle finger, painful around the cuticles, pink. Has tried triple antibiotic ointment. Wears gloves at work.).  The following portions of the chart were reviewed this encounter and updated as appropriate:      Review of Systems:  No other skin or systemic complaints except as noted in HPI or Assessment and Plan.  Objective  Well appearing patient in no apparent distress; mood and affect are within normal limits.  A focused examination was performed including hands, fingers. Relevant physical exam findings are noted in the Assessment and Plan.  Objective  Right 3rd Proximal Nail Fold: Mild erythema, edema and scale.  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   Recommend mild soap and moisturizing cream 1-2 times daily.  Avoid picking at nail or pushing back cuticle. Minimize hands in water.    clotrimazole-betamethasone (LOTRISONE) cream - Right 3rd Proximal Nail Fold  Hand dermatitis Hands  Recommend mild soap and routine use of moisturizing cream after handwashing.  Minimize soap/water exposure when possible.   May use Lotrisone cream qd/bid prn flares    Dove Sensitive Skin, Aveeno Eczema, Eucerin Eczema - samples given.  Return in about 6 weeks (around 11/27/2019) for paronychia.   [name], CMA, am acting as scribe for Willeen Niece, MD .  Documentation: I have reviewed the above documentation for accuracy and completeness, and I agree with the above.  Willeen Niece MD

## 2019-10-16 NOTE — Patient Instructions (Signed)
Recommend mild soap and moisturizing cream 1-2 times daily.

## 2019-11-27 ENCOUNTER — Other Ambulatory Visit: Payer: Self-pay

## 2019-11-27 ENCOUNTER — Ambulatory Visit (INDEPENDENT_AMBULATORY_CARE_PROVIDER_SITE_OTHER): Payer: Managed Care, Other (non HMO) | Admitting: Dermatology

## 2019-11-27 DIAGNOSIS — L03011 Cellulitis of right finger: Secondary | ICD-10-CM

## 2019-11-27 DIAGNOSIS — L739 Follicular disorder, unspecified: Secondary | ICD-10-CM | POA: Diagnosis not present

## 2019-11-27 DIAGNOSIS — L309 Dermatitis, unspecified: Secondary | ICD-10-CM | POA: Diagnosis not present

## 2019-11-27 MED ORDER — CICLOPIROX OLAMINE 0.77 % EX SUSP: CUTANEOUS | 1 refills | Status: AC

## 2019-11-27 NOTE — Progress Notes (Signed)
   Follow-Up Visit   Subjective  Carrie James is a 59 y.o. female who presents for the following: Paronychia (right 3rd proximal nail fold). Nail has improved some using Lotrisone cream once to twice daily. She is using [employer]. Patient also complains of itchy bumps in the scalp that come and go. She is not currently using a medicated shampoo.  The following portions of the chart were reviewed this encounter and updated as appropriate:      Review of Systems:  No other skin or systemic complaints except as noted in HPI or Assessment and Plan.  Objective  Well appearing patient in no apparent distress; mood and affect are within normal limits.  A focused examination was performed including hands, fingers, scalp. Relevant physical exam findings are noted in the Assessment and Plan.  Objective  Right 3rd Proximal Nail Fold: Hyperkeratosis on right 3rd fingertip; mild nail dystrophy; decreased erythema on nail folds.  Images    Objective  Bil hands: Xerosis  Objective  Scalp: Follicular based papules.   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 Start ciclopirox solution Apply to AA finger qam.   Recommend mild soap and routine use of moisturizing cream after handwashing.  Minimize soap/water exposure when possible.     ciclopirox (LOPROX) 0.77 % SUSP - Right 3rd Proximal Nail Fold  Other Related Medications clotrimazole-betamethasone (LOTRISONE) cream  Hand dermatitis Bil hands  Improved Continue Lotrisone Cream qd/bid prn flares.  Recommend mild soap and moisturizing cream 1-2 times daily.    Folliculitis Scalp  Start Head & Shoulders shampoo Apply to scalp and let sit several minutes before rinsing.  Start Ciclopirox suspension Apply to bumps qd/bid until improved.  Return in about 6 months (around  05/26/2020) for Paronychia, Folliculitis.   Carrie James, CMA, am acting as scribe for Willeen Niece, MD .  Documentation: I have reviewed the above documentation for accuracy and completeness, and I agree with the above.  Willeen Niece MD

## 2019-11-27 NOTE — Patient Instructions (Signed)
Ciclopirox suspension - Apply to nail every morning.  Lotrisone Cream - Apply to nail every evening x 2 weeks, then use as needed for flares. May also use 1-2 times daily to patches on hands.  Folliculitis Head and Shoulders shampoo Apply to scalp and let sit several minutes before rinsing. Ciclopirox - Apply to bumps on scalp 1-2 times a day as needed.

## 2020-06-23 ENCOUNTER — Other Ambulatory Visit: Payer: Self-pay | Admitting: Internal Medicine

## 2020-06-23 DIAGNOSIS — Z1231 Encounter for screening mammogram for malignant neoplasm of breast: Secondary | ICD-10-CM

## 2020-09-23 ENCOUNTER — Ambulatory Visit
Admission: RE | Admit: 2020-09-23 | Discharge: 2020-09-23 | Disposition: A | Discharge status: Home / Self Care | Payer: Managed Care, Other (non HMO) | Source: Ambulatory Visit | Attending: Internal Medicine | Admitting: Internal Medicine

## 2020-09-23 ENCOUNTER — Other Ambulatory Visit: Payer: Self-pay

## 2020-09-23 DIAGNOSIS — Z1231 Encounter for screening mammogram for malignant neoplasm of breast: Secondary | ICD-10-CM | POA: Insufficient documentation

## 2021-02-10 ENCOUNTER — Encounter
Admission: RE | Disposition: A | Discharge status: Home / Self Care | Payer: Self-pay | Source: Home / Self Care | Attending: Internal Medicine

## 2021-02-10 ENCOUNTER — Ambulatory Visit
Admission: RE | Admit: 2021-02-10 | Discharge: 2021-02-10 | Disposition: A | Discharge status: Home / Self Care | Payer: Managed Care, Other (non HMO) | Attending: Internal Medicine | Admitting: Internal Medicine

## 2021-02-10 ENCOUNTER — Ambulatory Visit: Payer: Managed Care, Other (non HMO) | Admitting: Certified Registered"

## 2021-02-10 DIAGNOSIS — E119 Type 2 diabetes mellitus without complications: Secondary | ICD-10-CM | POA: Diagnosis not present

## 2021-02-10 DIAGNOSIS — Z1211 Encounter for screening for malignant neoplasm of colon: Secondary | ICD-10-CM | POA: Insufficient documentation

## 2021-02-10 DIAGNOSIS — K640 First degree hemorrhoids: Secondary | ICD-10-CM | POA: Diagnosis not present

## 2021-02-10 HISTORY — PX: COLONOSCOPY WITH PROPOFOL: SHX5780

## 2021-02-10 LAB — GLUCOSE, CAPILLARY: Glucose-Capillary: 98 mg/dL (ref 70–99)

## 2021-02-10 SURGERY — COLONOSCOPY WITH PROPOFOL
Anesthesia: General

## 2021-02-10 MED ORDER — PROPOFOL 500 MG/50ML IV EMUL
INTRAVENOUS | Status: DC | PRN
  Administered 2021-02-10: 150 ug/kg/min via INTRAVENOUS

## 2021-02-10 MED ORDER — PROPOFOL 10 MG/ML IV BOLUS
INTRAVENOUS | Status: DC | PRN
  Administered 2021-02-10: 50 mg via INTRAVENOUS

## 2021-02-10 MED ORDER — SODIUM CHLORIDE 0.9 % IV SOLN
INTRAVENOUS | Status: DC
  Administered 2021-02-10: 20 mL/h via INTRAVENOUS

## 2021-02-10 NOTE — H&P (Signed)
Outpatient short stay form Pre-procedure 02/10/2021 12:34 PM Carrie James Carrie James, M.D.  Primary Physician: Enid Baas, M.D.  Reason for visit:  Colon cancer screening  History of present illness:  Patient presents for colonoscopy for colon cancer screening. The patient denies complaints of abdominal pain, significant change in bowel habits, or rectal bleeding.      Current Facility-Administered Medications:    0.9 %  sodium chloride infusion, , Intravenous, Continuous, [place], Boykin Nearing, MD, Last Rate: 20 mL/hr at 02/10/21 1233, Continued from Pre-op at 02/10/21 1233  Medications Prior to Admission  Medication Sig Dispense Refill Last Dose   albuterol (PROVENTIL HFA;VENTOLIN HFA) 108 (90 Base) MCG/ACT inhaler Inhale 1-2 puffs into the lungs every 6 (six) hours as needed for wheezing or shortness of breath. 1 Inhaler 5 Past Week   atorvastatin (LIPITOR) 20 MG tablet Take 40 mg by mouth daily.   Past Week   Cholecalciferol 1000 UNITS tablet Take 2,000 Units by mouth daily. Reported on 04/23/2015   Past Week   ciclopirox (LOPROX) 0.77 % SUSP Apply around affected fingernail every morning. 60 mL 1 Past Week   clotrimazole-betamethasone (LOTRISONE) cream Apply to affected area nail twice daily until improved. 15 g 1 Past Week   fluticasone (FLONASE) 50 MCG/ACT nasal spray Place 2 sprays into both nostrils daily. 16 g 6 Past Week   fluticasone furoate-vilanterol (BREO ELLIPTA) 100-25 MCG/INH AEPB Inhale 1 puff into the lungs daily. (Patient taking differently: Inhale 1 puff into the lungs as needed.) 28 each 0 Past Week   insulin glargine (LANTUS) 100 UNIT/ML injection Inject 14 Units into the skin at bedtime.   Past Week   levalbuterol (XOPENEX) 1.25 MG/3ML nebulizer solution Take 1.25 mg by nebulization every 4 (four) hours as needed for wheezing.   Past Week   levothyroxine (SYNTHROID, LEVOTHROID) 50 MCG tablet TAKE 1 TABLET BY MOUTH ONCE DAILY ON AN EMPTY STOMACH  WITH A GLASS OF WATER AT   LEAST 30-60 MINUTES BEFORE  BREAKFAST   Past Week   metFORMIN (GLUCOPHAGE) 500 MG tablet Take 500 mg by mouth 2 (two) times daily with a meal.    Past Week   ONE TOUCH ULTRA TEST test strip    Past Week   ONETOUCH DELICA LANCETS 33G MISC    Past Week   Semaglutide (RYBELSUS) 3 MG TABS Take 1 tablet by mouth daily.   02/09/2021     No Known Allergies   Past Medical History:  Diagnosis Date   Diabetes mellitus without complication (HCC)    Fibromyalgia    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    Hyperlipidemia    Obesity    PUD (peptic ulcer disease)    Varicose vein of leg     Review of systems:  Otherwise negative.    Physical Exam  Gen: Alert, oriented. Appears stated age.  HEENT: [place]/AT. PERRLA. Lungs: CTA, no wheezes. CV: RR nl S1, S2. Abd: soft, benign, no masses. BS+ Ext: No edema. Pulses 2+    Planned procedures: Proceed with colonoscopy. The patient understands the nature of the planned procedure, indications, risks, alternatives and potential complications including but not limited to bleeding, infection, perforation, damage to internal organs and possible oversedation/side effects from anesthesia. The patient agrees and gives consent to proceed.  Please refer to procedure notes for findings, recommendations and patient disposition/instructions.     Carrie James Carrie James, M.D. Gastroenterology 02/10/2021  12:34 PM

## 2021-02-10 NOTE — Anesthesia Preprocedure Evaluation (Signed)
Anesthesia Evaluation  Patient identified by MRN, date of birth, ID band Patient awake    Reviewed: Allergy & Precautions, H&P , NPO status , Patient's Chart, lab work & pertinent test results, reviewed documented beta blocker date and time   Airway Mallampati: II   Neck ROM: full    Dental  (+) Poor Dentition   Pulmonary neg pulmonary ROS,    Pulmonary exam normal        Cardiovascular negative cardio ROS Normal cardiovascular exam Rhythm:regular Rate:Normal     Neuro/Psych negative neurological ROS  negative psych ROS   GI/Hepatic negative GI ROS, Neg liver ROS,   Endo/Other  negative endocrine ROSdiabetes  Renal/GU negative Renal ROS  negative genitourinary   Musculoskeletal   Abdominal   Peds  Hematology negative hematology ROS (+)   Anesthesia Other Findings Past Medical History: No date: Diabetes mellitus without complication (HCC) No date: Fibromyalgia 05/26/2014: Frozen shoulder syndrome     Comment:  Left  No date: Hyperlipidemia No date: Obesity No date: PUD (peptic ulcer disease) No date: Varicose vein of leg Past Surgical History: 2002, 2005: NECK SURGERY     Comment:  fusion BMI    Body Mass Index: 27.44 kg/m     Reproductive/Obstetrics negative OB ROS                             Anesthesia Physical Anesthesia Plan  ASA: 3  Anesthesia Plan: General   Post-op Pain Management:    Induction:   PONV Risk Score and Plan:   Airway Management Planned:   Additional Equipment:   Intra-op Plan:   Post-operative Plan:   Informed Consent: I have reviewed the patients History and Physical, chart, labs and discussed the procedure including the risks, benefits and alternatives for the proposed anesthesia with the patient or authorized representative who has indicated his/her understanding and acceptance.     Dental Advisory Given  Plan Discussed with:  CRNA  Anesthesia Plan Comments:         Anesthesia Quick Evaluation

## 2021-02-10 NOTE — Op Note (Signed)
[facility] Gastroenterology Patient Name: Carrie James Procedure Date: 02/10/2021 12:51 PM MRN: [id number] Account #: [id number] Date of Birth: 11-28-1960 Admit Type: Outpatient Age: 60 Room: [facility] ENDO ROOM 2 Gender: Female Note Status: Finalized Instrument Name: Prentice Docker [id number] Procedure:             Colonoscopy Indications:           Screening for colorectal malignant neoplasm Providers:             Royce Macadamia K. [name] MD, MD Referring MD:          Enid Baas, MD (Referring MD) Medicines:             Propofol per Anesthesia Complications:         No immediate complications. Procedure:             Pre-Anesthesia Assessment:                        - The risks and benefits of the procedure and the                         sedation options and risks were discussed with the                         patient. All questions were answered and informed                         consent was obtained.                        - Patient identification and proposed procedure were                         verified prior to the procedure by the nurse. The                         procedure was verified in the procedure room.                        - ASA Grade Assessment: III - A patient with severe                         systemic disease.                        - After reviewing the risks and benefits, the patient                         was deemed in satisfactory condition to undergo the                         procedure.                        After obtaining informed consent, the colonoscope was                         passed under direct vision. Throughout the procedure,                         the  patient's blood pressure, pulse, and oxygen                         saturations were monitored continuously. The                         Colonoscope was introduced through the anus and                         advanced to the the cecum, identified by appendiceal                          orifice and ileocecal valve. The colonoscopy was                         performed without difficulty. The patient tolerated                         the procedure well. The quality of the bowel                         preparation was good. The ileocecal valve, appendiceal                         orifice, and rectum were photographed. Findings:      The perianal and digital rectal examinations were normal. Pertinent       negatives include normal sphincter tone and no palpable rectal lesions.      Non-bleeding internal hemorrhoids were found during retroflexion. The       hemorrhoids were Grade I (internal hemorrhoids that do not prolapse).      The colon (entire examined portion) appeared normal. Impression:            - Non-bleeding internal hemorrhoids.                        - The entire examined colon is normal.                        - No specimens collected. Recommendation:        - Patient has a contact number available for                         emergencies. The signs and symptoms of potential                         delayed complications were discussed with the patient.                         Return to normal activities tomorrow. Written                         discharge instructions were provided to the patient.                        - Resume previous diet.                        - Continue present medications.                        -  Repeat colonoscopy in 10 years for screening                         purposes.                        - Return to GI office PRN.                        - The findings and recommendations were discussed with                         the patient. Procedure Code(s):     --- Professional ---                        G2952, Colorectal cancer screening; colonoscopy on                         individual not meeting criteria for high risk Diagnosis Code(s):     --- Professional ---                        K64.0, First degree hemorrhoids                         Z12.11, Encounter for screening for malignant neoplasm                         of colon CPT copyright 2019 [employer]. All rights reserved. The codes documented in this report are preliminary and upon coder review may  be revised to meet current compliance requirements. Stanton Kidney MD, MD 02/10/2021 1:16:11 PM This report has been signed electronically. Number of Addenda: 0 Note Initiated On: 02/10/2021 12:51 PM Scope Withdrawal Time: 0 hours 5 minutes 28 seconds  Total Procedure Duration: 0 hours 8 minutes 24 seconds  Estimated Blood Loss:  Estimated blood loss: none.      [facility]

## 2021-02-10 NOTE — Transfer of Care (Signed)
Immediate Anesthesia Transfer of Care Note  Patient: Carrie James  Procedure(s) Performed: COLONOSCOPY WITH PROPOFOL  Patient Location: Endoscopy Unit  Anesthesia Type:General  Level of Consciousness: awake, alert  and oriented  Airway & Oxygen Therapy: Patient Spontanous Breathing  Post-op Assessment: Report given to RN and Post -op Vital signs reviewed and stable  Post vital signs: Reviewed and stable  Last Vitals:  Vitals Value Taken Time  BP    Temp    Pulse    Resp    SpO2      Last Pain:  Vitals:   02/10/21 1152  TempSrc: Temporal  PainSc: 0-No pain         Complications: No notable events documented.

## 2021-02-10 NOTE — Interval H&P Note (Signed)
History and Physical Interval Note:  02/10/2021 12:35 PM  Carrie James  has presented today for surgery, with the diagnosis of SCREENING.  The various methods of treatment have been discussed with the patient and family. After consideration of risks, benefits and other options for treatment, the patient has consented to  Procedure(s) with comments: COLONOSCOPY WITH PROPOFOL (N/A) - DM as a surgical intervention.  The patient's history has been reviewed, patient examined, no change in status, stable for surgery.  I have reviewed the patient's chart and labs.  Questions were answered to the patient's satisfaction.     Lewis, Lawrence

## 2021-02-11 ENCOUNTER — Encounter: Payer: Self-pay | Admitting: Internal Medicine

## 2021-02-15 NOTE — Anesthesia Postprocedure Evaluation (Signed)
Anesthesia Post Note  Patient: Carrie James  Procedure(s) Performed: COLONOSCOPY WITH PROPOFOL  Patient location during evaluation: PACU Anesthesia Type: General Level of consciousness: awake and alert Pain management: pain level controlled Vital Signs Assessment: post-procedure vital signs reviewed and stable Respiratory status: spontaneous breathing, nonlabored ventilation, respiratory function stable and patient connected to nasal cannula oxygen Cardiovascular status: blood pressure returned to baseline and stable Postop Assessment: no apparent nausea or vomiting Anesthetic complications: no   No notable events documented.   Last Vitals:  Vitals:   02/10/21 1321 02/10/21 1331  BP: 96/62 111/63  Pulse: 60 74  Resp: 19 17  Temp:    SpO2: 100% 100%    Last Pain:  Vitals:   02/10/21 1331  TempSrc:   PainSc: 0-No pain                 Yevette Edwards

## 2022-05-18 ENCOUNTER — Encounter: Payer: Self-pay | Admitting: Physical Therapy

## 2022-05-18 ENCOUNTER — Ambulatory Visit: Payer: Managed Care, Other (non HMO) | Attending: Sports Medicine | Admitting: Physical Therapy

## 2022-05-18 DIAGNOSIS — M25511 Pain in right shoulder: Secondary | ICD-10-CM | POA: Insufficient documentation

## 2022-05-18 DIAGNOSIS — G8929 Other chronic pain: Secondary | ICD-10-CM | POA: Insufficient documentation

## 2022-05-18 NOTE — Therapy (Signed)
OUTPATIENT PHYSICAL THERAPY SHOULDER EVALUATION   Patient Name: Carrie James MRN: [id number] DOB:[birth year], 62 y.o., female Today's Date: 05/18/2022  END OF SESSION:  PT End of Session - 05/18/22 1340     Visit Number 1    Number of Visits 10    Date for PT Re-Evaluation 08/05/22    Authorization - Visit Number 1    Authorization - Number of Visits 10    Progress Note Due on Visit 10    PT Start Time [id number]    PT Stop Time 1430    PT Time Calculation (min) 45 min    Activity Tolerance Patient tolerated treatment well    Behavior During Therapy [facility] for tasks assessed/performed             Past Medical History:  Diagnosis Date   Diabetes mellitus without complication ([place])    Fibromyalgia    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    Hyperlipidemia    Obesity    PUD (peptic ulcer disease)    Varicose vein of leg    Past Surgical History:  Procedure Laterality Date   COLONOSCOPY WITH PROPOFOL N/A 02/10/2021   Procedure: COLONOSCOPY WITH PROPOFOL;  Surgeon: Toledo, Benay Pike, MD;  Location: [facility] ENDOSCOPY;  Service: Gastroenterology;  Laterality: N/A;  DM   NECK SURGERY  2002, 2005   fusion   Patient Active Problem List   Diagnosis Date Noted   Abnormal ECG [id number]   Uncomplicated asthma 99991111   Adult BMI 30+ 10/29/2014   Cervical disc disorder 10/29/2014   Type 2 diabetes mellitus with complication, with long-term current use of insulin ([place]) 10/29/2014   Allergic rhinitis 10/29/2014   Gastric ulcer without hemorrhage or perforation 10/29/2014   Other obesity 10/29/2014   Pure hypercholesterolemia 10/29/2014   Headache 05/26/2014   Adhesive capsulitis of shoulder 05/26/2014    PCP: Tressia Miners MD  REFERRING PROVIDER: Candelaria Stagers  REFERRING DIAG: R shoulder pain   THERAPY DIAG:  Chronic right shoulder pain  Rationale for Evaluation and Treatment: Rehabilitation  ONSET DATE: Jan 2024  SUBJECTIVE:                                                                                                                                                                                       SUBJECTIVE STATEMENT: R shoulder pain   PERTINENT HISTORY: Pt reports R shoulder pain that began Jan 2024. Reports that she had more pain in Jan, that maybe her pain has gotten a little better since then. Pt reports her motion has not been good and is not improving over the past 2 months. Pain is anterior shoulder that feels  sharp and radiates to the ear and chest. Denies numbness/tingling sensation. Current pain 5/10; Best: 3/10 Worst: 8/10. Pt reports increased pain with reaching all directions, especially overhead to do her hair, shower, and put her clothes on. Has pain with lifting, pulling, pushing, and when anything puts pressure on her shoulder. 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 Pt reports she rides a recumbent stationary bike about 3x/week and is able to complete this still, and walks 5x/week. Pt is R handed. Pt denies N/V, B&B changes, unexplained weight fluctuation, saddle paresthesia, fever, night sweats, or unrelenting night pain at this time.   PAIN:  Are you having pain? Yes: NPRS scale: 5/10 Pain location: R shoulder Pain description: sharp Aggravating factors: reaching all directions, especially overhead. Has pain with lifting, pulling, pushing, and when anything puts pressure on her shoulder.  Relieving factors: rest  PRECAUTIONS: None  WEIGHT BEARING RESTRICTIONS: No  FALLS:  Has patient fallen in last 6 months? No  LIVING ENVIRONMENT: Lives with: lives with their family Lives in: House/apartment Stairs: Yes: Internal: 12 steps; none and External: 3 steps; can reach both Has following equipment at home: None  OCCUPATION: Full time med tech  PLOF: Independent  PATIENT GOALS:Get my movement back in my arm  NEXT MD VISIT:   OBJECTIVE:   DIAGNOSTIC FINDINGS:  Pt reported Xray at  [facility] with Dr. Candelaria Stagers- unremarkable  PATIENT SURVEYS:  FOTO 53 goal 67  COGNITION: Overall cognitive status: Within functional limits for tasks assessed     SENSATION: WFL  POSTURE: FHRS, increased thoracic kyphosis  UPPER EXTREMITY ROM:   Active ROM Right eval Left eval  Shoulder flexion 108/142 WNL  Shoulder extension 46 WNL  Shoulder abduction 80/84 WNL  Shoulder adduction    Shoulder internal rotation Apleys R PSIS/42 Apleys T10  Shoulder external rotation Apleys R ear/22 Apleys CTJ  Elbow flexion WNL WNL  Elbow extension WNL WNL  Wrist flexion    Wrist extension    Wrist ulnar deviation    Wrist radial deviation    Wrist pronation    Wrist supination    (Blank rows = not tested) All thoracic mobility WNL - with some tension at end range thoracic ext  UPPER EXTREMITY MMT:  MMT Right eval Left eval  Shoulder flexion 4+ 5  Shoulder extension    Shoulder abduction 4 4+  Shoulder adduction    Shoulder internal rotation 4 4+  Shoulder external rotation 3+ 4+  Middle trapezius 3 4+  Lower trapezius unable 4  Elbow flexion 5 5  Elbow extension 5 5  Wrist flexion    Wrist extension    Wrist ulnar deviation    Wrist radial deviation    Wrist pronation    Wrist supination    Grip strength (lbs)    (Blank rows = not tested)  SHOULDER SPECIAL TESTS:  All impingement tests positive (Neers, HK, painful arc), but generalized pain with all movements PALPATION:  TTP with trigger points to R UT and pec major/minor; concordant pain at deep palpation over ant joint line   TODAY'S TREATMENT:  DATE: 05/18/22 PT reviewed the following HEP with patient with patient able to demonstrate a set of the following with min cuing for correction needed. PT educated patient on parameters of therex (how/when to inc/decrease intensity,  frequency, rep/set range, stretch hold time, and purpose of therex) with verbalized understanding.     PATIENT EDUCATION: Education details: Patient was educated on diagnosis, anatomy and pathology involved, prognosis, role of PT, and was given an HEP, demonstrating exercise with proper form following verbal and tactile cues, and was given a paper hand out to continue exercise at home. Pt was educated on and agreed to plan of care.  Person educated: Patient Education method: Explanation, Demonstration, and Handouts Education comprehension: verbalized understanding, returned demonstration, and verbal cues required  HOME EXERCISE PROGRAM: Access Code: [email or web address]  - Seated Shoulder Flexion AAROM with Pulley Behind  - 1-2 x daily - 7 x weekly - 12-20 reps - Seated Shoulder Abduction AAROM with Pulley Behind  - 1-2 x daily - 7 x weekly - 12-20 reps - Standing Shoulder Internal Rotation AAROM with Pulley  - 1-2 x daily - 7 x weekly - 12-20 reps  ASSESSMENT:  CLINICAL IMPRESSION: Patient is a 62 y.o. female who was seen today for physical therapy evaluation and treatment for R shoulder pain. Signs and symptoms of adhesive capsulitis (capsular pattern, time frame pain > reduction in motion, patient demographic). Impairments in A/PROM, periscapular strength, abnormal posture, decreased thoracic ext, and pain. Activity limitations in all reaching, lifting, pushing/pulling, bathing, dressing, carrying; inhibiting job duties and ADLs. Would benefit from skilled PT to address above deficits and promote optimal return to PLOF.   OBJECTIVE IMPAIRMENTS: Abnormal gait, decreased activity tolerance, decreased coordination, decreased mobility, decreased ROM, decreased strength, increased fascial restrictions, impaired perceived functional ability, impaired flexibility, impaired UE functional use, improper body mechanics, postural dysfunction, and pain.   ACTIVITY LIMITATIONS: carrying, lifting, transfers,  bathing, dressing, reach over head, and hygiene/grooming  PARTICIPATION LIMITATIONS: meal prep, cleaning, laundry, driving, shopping, community activity, and yard work  PERSONAL FACTORS: Age, Fitness, Past/current experiences, Time since onset of injury/illness/exacerbation, and 3+ comorbidities: HLD, DM2, previous AC, obesity  are also affecting patient's functional outcome.   REHAB POTENTIAL: Good  CLINICAL DECISION MAKING: Evolving/moderate complexity  EVALUATION COMPLEXITY: Moderate   GOALS: Goals reviewed with patient? Yes  SHORT TERM GOALS: Target date: 06/17/22  Pt will be independent with HEP in order to improve strength and balance in order to decrease fall risk and improve function at home and work.  Baseline: HEP given  Goal status: INITIAL    LONG TERM GOALS: Target date: 08/04/22  Patient will increase FOTO score to 53 to demonstrate predicted increase in functional mobility to complete ADLs Baseline: 67 Goal status: INITIAL  2.  Pt will demonstrate full shoulder ROM in order to complete basic ADLs Baseline: see eval Goal status: INITIAL  3.  Pt will decrease worst pain as reported on NPRS by at least 3 points in order to demonstrate clinically significant reduction in pain.  Baseline: 8/10 Goal status: INITIAL   PLAN:  PT FREQUENCY: 1-2x/week  PT DURATION: 8 weeks  PLANNED INTERVENTIONS: Therapeutic exercises, Therapeutic activity, Neuromuscular re-education, Balance training, Gait training, Patient/Family education, Self Care, Joint mobilization, Joint manipulation, DME instructions, Dry Needling, Spinal manipulation, Cryotherapy, Moist heat, Traction, Ultrasound, Ionotophoresis '4mg'$ /ml Dexamethasone, Manual therapy, and Re-evaluation  PLAN FOR NEXT SESSION: mobility  Durwin Reges DPT Durwin Reges, PT 05/18/2022, 3:55 PM

## 2022-05-24 ENCOUNTER — Ambulatory Visit: Payer: Managed Care, Other (non HMO) | Admitting: Physical Therapy

## 2022-05-24 ENCOUNTER — Encounter: Payer: Self-pay | Admitting: Physical Therapy

## 2022-05-24 DIAGNOSIS — M25511 Pain in right shoulder: Secondary | ICD-10-CM | POA: Diagnosis not present

## 2022-05-24 DIAGNOSIS — G8929 Other chronic pain: Secondary | ICD-10-CM

## 2022-05-24 NOTE — Therapy (Signed)
OUTPATIENT PHYSICAL THERAPY SHOULDER EVALUATION   Patient Name: Carrie James MRN: [id number] DOB:07-Oct-1960, 62 y.o., female Today's Date: 05/24/2022  END OF SESSION:  PT End of Session - 05/24/22 1057     Visit Number 2    Number of Visits 10    Date for PT Re-Evaluation 08/05/22    Authorization - Visit Number 2    Authorization - Number of Visits 10    Progress Note Due on Visit 10    PT Start Time [id number]    PT Stop Time 1129    PT Time Calculation (min) 38 min    Activity Tolerance Patient tolerated treatment well    Behavior During Therapy [facility] for tasks assessed/performed              Past Medical History:  Diagnosis Date   Diabetes mellitus without complication ([place])    Fibromyalgia    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    Hyperlipidemia    Obesity    PUD (peptic ulcer disease)    Varicose vein of leg    Past Surgical History:  Procedure Laterality Date   COLONOSCOPY WITH PROPOFOL N/A 02/10/2021   Procedure: COLONOSCOPY WITH PROPOFOL;  Surgeon: Toledo, Benay Pike, MD;  Location: [facility] ENDOSCOPY;  Service: Gastroenterology;  Laterality: N/A;  DM   NECK SURGERY  2002, 2005   fusion   Patient Active Problem List   Diagnosis Date Noted   Abnormal ECG 123XX123   Uncomplicated asthma 99991111   Adult BMI 30+ 10/29/2014   Cervical disc disorder 10/29/2014   Type 2 diabetes mellitus with complication, with long-term current use of insulin ([place]) 10/29/2014   Allergic rhinitis 10/29/2014   Gastric ulcer without hemorrhage or perforation 10/29/2014   Other obesity 10/29/2014   Pure hypercholesterolemia 10/29/2014   Headache 05/26/2014   Adhesive capsulitis of shoulder 05/26/2014    PCP: Tressia Miners MD  REFERRING PROVIDER: Candelaria Stagers  REFERRING DIAG: R shoulder pain   THERAPY DIAG:  Chronic right shoulder pain  Rationale for Evaluation and Treatment: Rehabilitation  ONSET DATE: Jan 2024  SUBJECTIVE:                                                                                                                                                                                       SUBJECTIVE STATEMENT: Pt reports she has not completed any HEP exercises yet. Had a lot of pain yesterday while doing her hair. Reports 5/10 pain today   PERTINENT HISTORY: Pt reports R shoulder pain that began Jan 2024. Reports that she had more pain in Jan, that maybe her pain has gotten a little better since then. Pt  reports her motion has not been good and is not improving over the past 2 months. Pain is anterior shoulder that feels sharp and radiates to the ear and chest. Denies numbness/tingling sensation. Current pain 5/10; Best: 3/10 Worst: 8/10. Pt reports increased pain with reaching all directions, especially overhead to do her hair, shower, and put her clothes on. Has pain with lifting, pulling, pushing, and when anything puts pressure on her shoulder. 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 Pt reports she rides a recumbent stationary bike about 3x/week and is able to complete this still, and walks 5x/week. Pt is R handed. Pt denies N/V, B&B changes, unexplained weight fluctuation, saddle paresthesia, fever, night sweats, or unrelenting night pain at this time.   PAIN:  Are you having pain? Yes: NPRS scale: 5/10 Pain location: R shoulder Pain description: sharp Aggravating factors: reaching all directions, especially overhead. Has pain with lifting, pulling, pushing, and when anything puts pressure on her shoulder.  Relieving factors: rest  PRECAUTIONS: None  WEIGHT BEARING RESTRICTIONS: No  FALLS:  Has patient fallen in last 6 months? No  LIVING ENVIRONMENT: Lives with: lives with their family Lives in: House/apartment Stairs: Yes: Internal: 12 steps; none and External: 3 steps; can reach both Has following equipment at home: None  OCCUPATION: Full time med tech  PLOF: Independent  PATIENT  GOALS:Get my movement back in my arm  NEXT MD VISIT:   OBJECTIVE:   DIAGNOSTIC FINDINGS:  Pt reported Xray at [facility] with Dr. Candelaria Stagers- unremarkable  PATIENT SURVEYS:  FOTO 53 goal 67  COGNITION: Overall cognitive status: Within functional limits for tasks assessed     SENSATION: WFL  POSTURE: FHRS, increased thoracic kyphosis  UPPER EXTREMITY ROM:   Active ROM Right eval Left eval  Shoulder flexion 108/142 WNL  Shoulder extension 46 WNL  Shoulder abduction 80/84 WNL  Shoulder adduction    Shoulder internal rotation Apleys R PSIS/42 Apleys T10  Shoulder external rotation Apleys R ear/22 Apleys CTJ  Elbow flexion WNL WNL  Elbow extension WNL WNL  Wrist flexion    Wrist extension    Wrist ulnar deviation    Wrist radial deviation    Wrist pronation    Wrist supination    (Blank rows = not tested) All thoracic mobility WNL - with some tension at end range thoracic ext  UPPER EXTREMITY MMT:  MMT Right eval Left eval  Shoulder flexion 4+ 5  Shoulder extension    Shoulder abduction 4 4+  Shoulder adduction    Shoulder internal rotation 4 4+  Shoulder external rotation 3+ 4+  Middle trapezius 3 4+  Lower trapezius unable 4  Elbow flexion 5 5  Elbow extension 5 5  Wrist flexion    Wrist extension    Wrist ulnar deviation    Wrist radial deviation    Wrist pronation    Wrist supination    Grip strength (lbs)    (Blank rows = not tested)  SHOULDER SPECIAL TESTS:  All impingement tests positive (Neers, HK, painful arc), but generalized pain with all movements PALPATION:  TTP with trigger points to R UT and pec major/minor; concordant pain at deep palpation over ant joint line   TODAY'S TREATMENT:  DATE: 05/18/22 Pulleys AAROM flex x15 abd x15 IR x15 with cuing throughout for technique Hooklying AAROM PVC overhead flex  with towel at T10 for increased tspine ext x12; same set up AAROM ER x12 IR x12 Pec stretch in hooklying with towel along spine 60secH Hooklying serratus punch 2x 12 with good carry over of initial cuing for technique  Standing row GTB x12 with good carry over of initial demo  Seated chest press 1# DB x12 with min cuing throughout to maintain elbow height   Standing row GTB x12 Seated chest press 2# DB x12  Seated lat stretch 2x 30secH Post capsule stretch 2x 30secH   Post shoulder rolls x15   PATIENT EDUCATION: Education details: Patient was educated on diagnosis, anatomy and pathology involved, prognosis, role of PT, and was given an HEP, demonstrating exercise with proper form following verbal and tactile cues, and was given a paper hand out to continue exercise at home. Pt was educated on and agreed to plan of care.  Person educated: Patient Education method: Explanation, Demonstration, and Handouts Education comprehension: verbalized understanding, returned demonstration, and verbal cues required  HOME EXERCISE PROGRAM: Access Code: [email or web address]  - Seated Shoulder Flexion AAROM with Pulley Behind  - 1-2 x daily - 7 x weekly - 12-20 reps - Seated Shoulder Abduction AAROM with Pulley Behind  - 1-2 x daily - 7 x weekly - 12-20 reps - Standing Shoulder Internal Rotation AAROM with Pulley  - 1-2 x daily - 7 x weekly - 12-20 reps  ASSESSMENT:  CLINICAL IMPRESSION: PT initiated therex progression for increased scapulohumeral rhythm, mobility and scapulohumeral strengthening with success. Patient is able to complete all therex with proper technique following multimodal cuing with good motivation throughout session. PT will continue progression as able.    OBJECTIVE IMPAIRMENTS: Abnormal gait, decreased activity tolerance, decreased coordination, decreased mobility, decreased ROM, decreased strength, increased fascial restrictions, impaired perceived functional ability, impaired  flexibility, impaired UE functional use, improper body mechanics, postural dysfunction, and pain.   ACTIVITY LIMITATIONS: carrying, lifting, transfers, bathing, dressing, reach over head, and hygiene/grooming  PARTICIPATION LIMITATIONS: meal prep, cleaning, laundry, driving, shopping, community activity, and yard work  PERSONAL FACTORS: Age, Fitness, Past/current experiences, Time since onset of injury/illness/exacerbation, and 3+ comorbidities: HLD, DM2, previous AC, obesity  are also affecting patient's functional outcome.   REHAB POTENTIAL: Good  CLINICAL DECISION MAKING: Evolving/moderate complexity  EVALUATION COMPLEXITY: Moderate   GOALS: Goals reviewed with patient? Yes  SHORT TERM GOALS: Target date: 06/17/22  Pt will be independent with HEP in order to improve strength and balance in order to decrease fall risk and improve function at home and work.  Baseline: HEP given  Goal status: INITIAL    LONG TERM GOALS: Target date: 08/04/22  Patient will increase FOTO score to 53 to demonstrate predicted increase in functional mobility to complete ADLs Baseline: 67 Goal status: INITIAL  2.  Pt will demonstrate full shoulder ROM in order to complete basic ADLs Baseline: see eval Goal status: INITIAL  3.  Pt will decrease worst pain as reported on NPRS by at least 3 points in order to demonstrate clinically significant reduction in pain.  Baseline: 8/10 Goal status: INITIAL   PLAN:  PT FREQUENCY: 1-2x/week  PT DURATION: 8 weeks  PLANNED INTERVENTIONS: Therapeutic exercises, Therapeutic activity, Neuromuscular re-education, Balance training, Gait training, Patient/Family education, Self Care, Joint mobilization, Joint manipulation, DME instructions, Dry Needling, Spinal manipulation, Cryotherapy, Moist heat, Traction, Ultrasound, Ionotophoresis 4mg /ml Dexamethasone, Manual therapy, and Re-evaluation  PLAN FOR NEXT SESSION: mobility  Durwin Reges DPT Durwin Reges, PT 05/24/2022, 12:13 PM

## 2022-05-26 ENCOUNTER — Encounter: Payer: Managed Care, Other (non HMO) | Admitting: Physical Therapy

## 2022-05-30 ENCOUNTER — Encounter: Payer: Managed Care, Other (non HMO) | Admitting: Physical Therapy

## 2022-06-02 ENCOUNTER — Encounter: Payer: Self-pay | Admitting: Physical Therapy

## 2022-06-02 ENCOUNTER — Ambulatory Visit: Payer: Managed Care, Other (non HMO) | Admitting: Physical Therapy

## 2022-06-02 DIAGNOSIS — M25511 Pain in right shoulder: Secondary | ICD-10-CM | POA: Diagnosis not present

## 2022-06-02 DIAGNOSIS — G8929 Other chronic pain: Secondary | ICD-10-CM

## 2022-06-02 NOTE — Therapy (Signed)
OUTPATIENT PHYSICAL THERAPY SHOULDER EVALUATION   Patient Name: Carrie James MRN: [id number] DOB:09-May-1960, 62 y.o., female Today's Date: 06/02/2022  END OF SESSION:  PT End of Session - 06/02/22 0925     Visit Number 3    Number of Visits 10    Date for PT Re-Evaluation 08/05/22    Authorization - Visit Number 3    Authorization - Number of Visits 10    Progress Note Due on Visit 10    PT Start Time [phone]   pt arrived late   PT Stop Time 1001    PT Time Calculation (min) 38 min    Activity Tolerance Patient tolerated treatment well    Behavior During Therapy [facility] for tasks assessed/performed               Past Medical History:  Diagnosis Date   Diabetes mellitus without complication ([place])    Fibromyalgia    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    Hyperlipidemia    Obesity    PUD (peptic ulcer disease)    Varicose vein of leg    Past Surgical History:  Procedure Laterality Date   COLONOSCOPY WITH PROPOFOL N/A 02/10/2021   Procedure: COLONOSCOPY WITH PROPOFOL;  Surgeon: Toledo, Benay Pike, MD;  Location: [facility] ENDOSCOPY;  Service: Gastroenterology;  Laterality: N/A;  DM   NECK SURGERY  2002, 2005   fusion   Patient Active Problem List   Diagnosis Date Noted   Abnormal ECG 123XX123   Uncomplicated asthma 99991111   Adult BMI 30+ 10/29/2014   Cervical disc disorder 10/29/2014   Type 2 diabetes mellitus with complication, with long-term current use of insulin ([place]) 10/29/2014   Allergic rhinitis 10/29/2014   Gastric ulcer without hemorrhage or perforation 10/29/2014   Other obesity 10/29/2014   Pure hypercholesterolemia 10/29/2014   Headache 05/26/2014   Adhesive capsulitis of shoulder 05/26/2014    PCP: Tressia Miners MD  REFERRING PROVIDER: Candelaria Stagers  REFERRING DIAG: R shoulder pain   THERAPY DIAG:  Chronic right shoulder pain  Rationale for Evaluation and Treatment: Rehabilitation  ONSET DATE: Jan 2024  SUBJECTIVE:                                                                                                                                                                                       SUBJECTIVE STATEMENT: Pt reports completing HEP exercises and some strengthening. Strengthening exercises are making her a little sore. Reports 3/10 pain today- improving ROM.   PERTINENT HISTORY: Pt reports R shoulder pain that began Jan 2024. Reports that she had more pain in Jan, that maybe her pain has gotten a little better  since then. Pt reports her motion has not been good and is not improving over the past 2 months. Pain is anterior shoulder that feels sharp and radiates to the ear and chest. Denies numbness/tingling sensation. Current pain 5/10; Best: 3/10 Worst: 8/10. Pt reports increased pain with reaching all directions, especially overhead to do her hair, shower, and put her clothes on. Has pain with lifting, pulling, pushing, and when anything puts pressure on her shoulder. 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 Pt reports she rides a recumbent stationary bike about 3x/week and is able to complete this still, and walks 5x/week. Pt is R handed. Pt denies N/V, B&B changes, unexplained weight fluctuation, saddle paresthesia, fever, night sweats, or unrelenting night pain at this time.   PAIN:  Are you having pain? Yes: NPRS scale: 5/10 Pain location: R shoulder Pain description: sharp Aggravating factors: reaching all directions, especially overhead. Has pain with lifting, pulling, pushing, and when anything puts pressure on her shoulder.  Relieving factors: rest  PRECAUTIONS: None  WEIGHT BEARING RESTRICTIONS: No  FALLS:  Has patient fallen in last 6 months? No  LIVING ENVIRONMENT: Lives with: lives with their family Lives in: House/apartment Stairs: Yes: Internal: 12 steps; none and External: 3 steps; can reach both Has following equipment at home: None  OCCUPATION: Full time  med tech  PLOF: Independent  PATIENT GOALS:Get my movement back in my arm  NEXT MD VISIT:   OBJECTIVE:   DIAGNOSTIC FINDINGS:  Pt reported Xray at [facility] with Dr. Candelaria Stagers- unremarkable  PATIENT SURVEYS:  FOTO 53 goal 67  COGNITION: Overall cognitive status: Within functional limits for tasks assessed     SENSATION: WFL  POSTURE: FHRS, increased thoracic kyphosis  UPPER EXTREMITY ROM:   Active ROM Right eval Left eval  Shoulder flexion 108/142 WNL  Shoulder extension 46 WNL  Shoulder abduction 80/84 WNL  Shoulder adduction    Shoulder internal rotation Apleys R PSIS/42 Apleys T10  Shoulder external rotation Apleys R ear/22 Apleys CTJ  Elbow flexion WNL WNL  Elbow extension WNL WNL  Wrist flexion    Wrist extension    Wrist ulnar deviation    Wrist radial deviation    Wrist pronation    Wrist supination    (Blank rows = not tested) All thoracic mobility WNL - with some tension at end range thoracic ext  UPPER EXTREMITY MMT:  MMT Right eval Left eval  Shoulder flexion 4+ 5  Shoulder extension    Shoulder abduction 4 4+  Shoulder adduction    Shoulder internal rotation 4 4+  Shoulder external rotation 3+ 4+  Middle trapezius 3 4+  Lower trapezius unable 4  Elbow flexion 5 5  Elbow extension 5 5  Wrist flexion    Wrist extension    Wrist ulnar deviation    Wrist radial deviation    Wrist pronation    Wrist supination    Grip strength (lbs)    (Blank rows = not tested)  SHOULDER SPECIAL TESTS:  All impingement tests positive (Neers, HK, painful arc), but generalized pain with all movements PALPATION:  TTP with trigger points to R UT and pec major/minor; concordant pain at deep palpation over ant joint line   TODAY'S TREATMENT:  DATE: 05/18/22 Pulleys AAROM flex x12 abd x12  with cuing throughout for  technique Towel across back IR x12 with good carry over of demo  Hooklying AAROM PVC overhead flex with towel at T10 for increased tspine ext x12; same set up AAROM ER <> IR x12 Pec stretch in hooklying with towel along spine 60secH  Hooklying serratus punch 2# DB x12 with good carry over of initial cuing for technique Lat pullover 4# x12 with good carry over of cuing for initial cuing for scapulohumeral rhythm  Hooklying serratus punch 2# DB x12  Lat pullover 4# x12   Standing row GTB x12 with good carry over of initial demo  Seated chest press 2# DB x10 with min cuing throughout to maintain elbow height  Seated bilat scaption x10 with cuing for scapulohumeral rhythm with decent carry over  Standing row GTB x12 Seated chest press 2# DB x12 Seated bilat scaption x10   Post capsule stretch x30secH Post shoulder rolls x15   PATIENT EDUCATION: Education details: Patient was educated on diagnosis, anatomy and pathology involved, prognosis, role of PT, and was given an HEP, demonstrating exercise with proper form following verbal and tactile cues, and was given a paper hand out to continue exercise at home. Pt was educated on and agreed to plan of care.  Person educated: Patient Education method: Explanation, Demonstration, and Handouts Education comprehension: verbalized understanding, returned demonstration, and verbal cues required  HOME EXERCISE PROGRAM: Access Code: [email or web address]  - Seated Shoulder Flexion AAROM with Pulley Behind  - 1-2 x daily - 7 x weekly - 12-20 reps - Seated Shoulder Abduction AAROM with Pulley Behind  - 1-2 x daily - 7 x weekly - 12-20 reps - Standing Shoulder Internal Rotation AAROM with Pulley  - 1-2 x daily - 7 x weekly - 12-20 reps  ASSESSMENT:  CLINICAL IMPRESSION: PT continued therex progression for increased scapulohumeral rhythm, mobility and RTC/periscapular strengthening with success. Patient is able to complete all therex with proper technique  following multimodal cuing with good motivation throughout session. PT will continue progression as able.    OBJECTIVE IMPAIRMENTS: Abnormal gait, decreased activity tolerance, decreased coordination, decreased mobility, decreased ROM, decreased strength, increased fascial restrictions, impaired perceived functional ability, impaired flexibility, impaired UE functional use, improper body mechanics, postural dysfunction, and pain.   ACTIVITY LIMITATIONS: carrying, lifting, transfers, bathing, dressing, reach over head, and hygiene/grooming  PARTICIPATION LIMITATIONS: meal prep, cleaning, laundry, driving, shopping, community activity, and yard work  PERSONAL FACTORS: Age, Fitness, Past/current experiences, Time since onset of injury/illness/exacerbation, and 3+ comorbidities: HLD, DM2, previous AC, obesity  are also affecting patient's functional outcome.   REHAB POTENTIAL: Good  CLINICAL DECISION MAKING: Evolving/moderate complexity  EVALUATION COMPLEXITY: Moderate   GOALS: Goals reviewed with patient? Yes  SHORT TERM GOALS: Target date: 06/17/22  Pt will be independent with HEP in order to improve strength and balance in order to decrease fall risk and improve function at home and work.  Baseline: HEP given  Goal status: INITIAL    LONG TERM GOALS: Target date: 08/04/22  Patient will increase FOTO score to 53 to demonstrate predicted increase in functional mobility to complete ADLs Baseline: 67 Goal status: INITIAL  2.  Pt will demonstrate full shoulder ROM in order to complete basic ADLs Baseline: see eval Goal status: INITIAL  3.  Pt will decrease worst pain as reported on NPRS by at least 3 points in order to demonstrate clinically significant reduction in pain.  Baseline: 8/10 Goal status: INITIAL  PLAN:  PT FREQUENCY: 1-2x/week  PT DURATION: 8 weeks  PLANNED INTERVENTIONS: Therapeutic exercises, Therapeutic activity, Neuromuscular re-education, Balance  training, Gait training, Patient/Family education, Self Care, Joint mobilization, Joint manipulation, DME instructions, Dry Needling, Spinal manipulation, Cryotherapy, Moist heat, Traction, Ultrasound, Ionotophoresis 4mg /ml Dexamethasone, Manual therapy, and Re-evaluation  PLAN FOR NEXT SESSION: mobility  Durwin Reges DPT Durwin Reges, PT 06/02/2022, 10:00 AM

## 2022-06-07 ENCOUNTER — Encounter: Payer: Self-pay | Admitting: Physical Therapy

## 2022-06-07 ENCOUNTER — Ambulatory Visit: Payer: Managed Care, Other (non HMO) | Attending: Sports Medicine | Admitting: Physical Therapy

## 2022-06-07 DIAGNOSIS — G8929 Other chronic pain: Secondary | ICD-10-CM | POA: Insufficient documentation

## 2022-06-07 DIAGNOSIS — M25511 Pain in right shoulder: Secondary | ICD-10-CM | POA: Diagnosis present

## 2022-06-07 NOTE — Therapy (Signed)
OUTPATIENT PHYSICAL THERAPY SHOULDER EVALUATION   Patient Name: Carrie James MRN: [id number] DOB:07-04-1960, 62 y.o., female Today's Date: 06/07/2022  END OF SESSION:  PT End of Session - 06/07/22 1006     Visit Number 4    Number of Visits 10    Date for PT Re-Evaluation 08/05/22    Authorization - Visit Number 4    Authorization - Number of Visits 10    Progress Note Due on Visit 10    PT Start Time 1005    PT Stop Time 1043    PT Time Calculation (min) 38 min    Activity Tolerance Patient tolerated treatment well    Behavior During Therapy [facility] for tasks assessed/performed                Past Medical History:  Diagnosis Date   Diabetes mellitus without complication    Fibromyalgia    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    Hyperlipidemia    Obesity    PUD (peptic ulcer disease)    Varicose vein of leg    Past Surgical History:  Procedure Laterality Date   COLONOSCOPY WITH PROPOFOL N/A 02/10/2021   Procedure: COLONOSCOPY WITH PROPOFOL;  Surgeon: Toledo, Benay Pike, MD;  Location: [facility] ENDOSCOPY;  Service: Gastroenterology;  Laterality: N/A;  DM   NECK SURGERY  2002, 2005   fusion   Patient Active Problem List   Diagnosis Date Noted   Abnormal ECG 123XX123   Uncomplicated asthma 99991111   Adult BMI 30+ 10/29/2014   Cervical disc disorder 10/29/2014   Type 2 diabetes mellitus with complication, with long-term current use of insulin 10/29/2014   Allergic rhinitis 10/29/2014   Gastric ulcer without hemorrhage or perforation 10/29/2014   Other obesity 10/29/2014   Pure hypercholesterolemia 10/29/2014   Headache 05/26/2014   Adhesive capsulitis of shoulder 05/26/2014    PCP: Tressia Miners MD  REFERRING PROVIDER: Candelaria Stagers  REFERRING DIAG: R shoulder pain   THERAPY DIAG:  Chronic right shoulder pain  Rationale for Evaluation and Treatment: Rehabilitation  ONSET DATE: Jan 2024  SUBJECTIVE:                                                                                                                                                                                       SUBJECTIVE STATEMENT: Pt reports not doing great today, from cooking a lot over the weekend. Reports pain is 6/10 today.   PERTINENT HISTORY: Pt reports R shoulder pain that began Jan 2024. Reports that she had more pain in Jan, that maybe her pain has gotten a little better since then. Pt reports her motion has not been  good and is not improving over the past 2 months. Pain is anterior shoulder that feels sharp and radiates to the ear and chest. Denies numbness/tingling sensation. Current pain 5/10; Best: 3/10 Worst: 8/10. Pt reports increased pain with reaching all directions, especially overhead to do her hair, shower, and put her clothes on. Has pain with lifting, pulling, pushing, and when anything puts pressure on her shoulder. 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 Pt reports she rides a recumbent stationary bike about 3x/week and is able to complete this still, and walks 5x/week. Pt is R handed. Pt denies N/V, B&B changes, unexplained weight fluctuation, saddle paresthesia, fever, night sweats, or unrelenting night pain at this time.   PAIN:  Are you having pain? Yes: NPRS scale: 5/10 Pain location: R shoulder Pain description: sharp Aggravating factors: reaching all directions, especially overhead. Has pain with lifting, pulling, pushing, and when anything puts pressure on her shoulder.  Relieving factors: rest  PRECAUTIONS: None  WEIGHT BEARING RESTRICTIONS: No  FALLS:  Has patient fallen in last 6 months? No  LIVING ENVIRONMENT: Lives with: lives with their family Lives in: House/apartment Stairs: Yes: Internal: 12 steps; none and External: 3 steps; can reach both Has following equipment at home: None  OCCUPATION: Full time med tech  PLOF: Independent  PATIENT GOALS:Get my movement back in my  arm  NEXT MD VISIT:   OBJECTIVE:   DIAGNOSTIC FINDINGS:  Pt reported Xray at [facility] with Dr. Candelaria Stagers- unremarkable  PATIENT SURVEYS:  FOTO 53 goal 67  COGNITION: Overall cognitive status: Within functional limits for tasks assessed     SENSATION: WFL  POSTURE: FHRS, increased thoracic kyphosis  UPPER EXTREMITY ROM:   Active ROM Right eval Left eval  Shoulder flexion 108/142 WNL  Shoulder extension 46 WNL  Shoulder abduction 80/84 WNL  Shoulder adduction    Shoulder internal rotation Apleys R PSIS/42 Apleys T10  Shoulder external rotation Apleys R ear/22 Apleys CTJ  Elbow flexion WNL WNL  Elbow extension WNL WNL  Wrist flexion    Wrist extension    Wrist ulnar deviation    Wrist radial deviation    Wrist pronation    Wrist supination    (Blank rows = not tested) All thoracic mobility WNL - with some tension at end range thoracic ext  UPPER EXTREMITY MMT:  MMT Right eval Left eval  Shoulder flexion 4+ 5  Shoulder extension    Shoulder abduction 4 4+  Shoulder adduction    Shoulder internal rotation 4 4+  Shoulder external rotation 3+ 4+  Middle trapezius 3 4+  Lower trapezius unable 4  Elbow flexion 5 5  Elbow extension 5 5  Wrist flexion    Wrist extension    Wrist ulnar deviation    Wrist radial deviation    Wrist pronation    Wrist supination    Grip strength (lbs)    (Blank rows = not tested)  SHOULDER SPECIAL TESTS:  All impingement tests positive (Neers, HK, painful arc), but generalized pain with all movements PALPATION:  TTP with trigger points to R UT and pec major/minor; concordant pain at deep palpation over ant joint line   TODAY'S TREATMENT:  DATE: 05/18/22 Pulleys AAROM flex x12 abd x12  with cuing throughout for technique Towel across back IR x12  Hooklying AAROM PVC overhead flex with towel  at T10 for increased tspine ext x12; same set up abd x12 and AAROM ER <> IR x12 Pec stretch in hooklying with towel along spine 60secH R open books x12 with cuing for set up with good carry over  Sidelying ER x12 Sidelying abd x12  Sidelying ER x12 Sidelying abd x12  Hooklying serratus punch 4# DB x12  Lat pullover 4# x12 with min cuing for scapulohumeral rhythm w/ good carry over  Hooklying serratus punch 2# DB x12  Lat pullover 4# x12   Post capsule stretch x30secH Post shoulder rolls x15; ant x15   PATIENT EDUCATION: Education details: Patient was educated on diagnosis, anatomy and pathology involved, prognosis, role of PT, and was given an HEP, demonstrating exercise with proper form following verbal and tactile cues, and was given a paper hand out to continue exercise at home. Pt was educated on and agreed to plan of care.  Person educated: Patient Education method: Explanation, Demonstration, and Handouts Education comprehension: verbalized understanding, returned demonstration, and verbal cues required  HOME EXERCISE PROGRAM: Access Code: [email or web address]  - Seated Shoulder Flexion AAROM with Pulley Behind  - 1-2 x daily - 7 x weekly - 12-20 reps - Seated Shoulder Abduction AAROM with Pulley Behind  - 1-2 x daily - 7 x weekly - 12-20 reps - Standing Shoulder Internal Rotation AAROM with Pulley  - 1-2 x daily - 7 x weekly - 12-20 reps  ASSESSMENT:  CLINICAL IMPRESSION: PT continued therex progression for increased scapulohumeral rhythm, mobility and RTC/periscapular strengthening with success. PT with more mobility focus this session d/t increased pain report from patient on arrival Patient is able to complete all therex with proper technique following multimodal cuing with good motivation throughout session.Reduced pain to 4/10 at end of session. PT will continue progression as able.    OBJECTIVE IMPAIRMENTS: Abnormal gait, decreased activity tolerance, decreased coordination,  decreased mobility, decreased ROM, decreased strength, increased fascial restrictions, impaired perceived functional ability, impaired flexibility, impaired UE functional use, improper body mechanics, postural dysfunction, and pain.   ACTIVITY LIMITATIONS: carrying, lifting, transfers, bathing, dressing, reach over head, and hygiene/grooming  PARTICIPATION LIMITATIONS: meal prep, cleaning, laundry, driving, shopping, community activity, and yard work  PERSONAL FACTORS: Age, Fitness, Past/current experiences, Time since onset of injury/illness/exacerbation, and 3+ comorbidities: HLD, DM2, previous AC, obesity  are also affecting patient's functional outcome.   REHAB POTENTIAL: Good  CLINICAL DECISION MAKING: Evolving/moderate complexity  EVALUATION COMPLEXITY: Moderate   GOALS: Goals reviewed with patient? Yes  SHORT TERM GOALS: Target date: 06/17/22  Pt will be independent with HEP in order to improve strength and balance in order to decrease fall risk and improve function at home and work.  Baseline: HEP given  Goal status: INITIAL    LONG TERM GOALS: Target date: 08/04/22  Patient will increase FOTO score to 53 to demonstrate predicted increase in functional mobility to complete ADLs Baseline: 67 Goal status: INITIAL  2.  Pt will demonstrate full shoulder ROM in order to complete basic ADLs Baseline: see eval Goal status: INITIAL  3.  Pt will decrease worst pain as reported on NPRS by at least 3 points in order to demonstrate clinically significant reduction in pain.  Baseline: 8/10 Goal status: INITIAL   PLAN:  PT FREQUENCY: 1-2x/week  PT DURATION: 8 weeks  PLANNED INTERVENTIONS: Therapeutic exercises, Therapeutic activity,  Neuromuscular re-education, Balance training, Gait training, Patient/Family education, Self Care, Joint mobilization, Joint manipulation, DME instructions, Dry Needling, Spinal manipulation, Cryotherapy, Moist heat, Traction, Ultrasound,  Ionotophoresis 4mg /ml Dexamethasone, Manual therapy, and Re-evaluation  PLAN FOR NEXT SESSION: mobility  Durwin Reges DPT Durwin Reges, PT 06/07/2022, 10:47 AM

## 2022-06-13 ENCOUNTER — Encounter: Payer: Managed Care, Other (non HMO) | Admitting: Physical Therapy

## 2022-06-15 ENCOUNTER — Ambulatory Visit: Payer: Managed Care, Other (non HMO)

## 2022-06-15 DIAGNOSIS — G8929 Other chronic pain: Secondary | ICD-10-CM

## 2022-06-15 DIAGNOSIS — M25511 Pain in right shoulder: Secondary | ICD-10-CM | POA: Diagnosis not present

## 2022-06-15 NOTE — Therapy (Signed)
OUTPATIENT PHYSICAL THERAPY SHOULDER TREATMENT   Patient Name: Carrie James MRN: [id number] DOB:1960/11/08, 62 y.o., female Today's Date: 06/15/2022  END OF SESSION:  PT End of Session - 06/15/22 1053     Visit Number 5    Number of Visits 10    Date for PT Re-Evaluation 08/05/22    Authorization - Visit Number 5    Authorization - Number of Visits 10    Progress Note Due on Visit 10    PT Start Time 1053    PT Stop Time 1130    PT Time Calculation (min) 37 min    Activity Tolerance Patient tolerated treatment well    Behavior [facility] for tasks assessed/performed              Past Medical History:  Diagnosis Date   Diabetes mellitus without complication    Fibromyalgia    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    Hyperlipidemia    Obesity    PUD (peptic ulcer disease)    Varicose vein of leg    Past Surgical History:  Procedure Laterality Date   COLONOSCOPY WITH PROPOFOL N/A 02/10/2021   Procedure: COLONOSCOPY WITH PROPOFOL;  Surgeon: Toledo, Boykin Nearingeodoro K, MD;  Location: [facility] ENDOSCOPY;  Service: Gastroenterology;  Laterality: N/A;  DM   NECK SURGERY  2002, 2005   fusion   Patient Active Problem List   Diagnosis Date Noted   Abnormal ECG 12/15/2014   Uncomplicated asthma 10/29/2014   Adult BMI 30+ 10/29/2014   Cervical disc disorder 10/29/2014   Type 2 diabetes mellitus with complication, with long-term current use of insulin 10/29/2014   Allergic rhinitis 10/29/2014   Gastric ulcer without hemorrhage or perforation 10/29/2014   Other obesity 10/29/2014   Pure hypercholesterolemia 10/29/2014   Headache 05/26/2014   Adhesive capsulitis of shoulder 05/26/2014    PCP: Nemiah CommanderKalisetti MD  REFERRING PROVIDER: Landry MellowKubinski  REFERRING DIAG: R shoulder pain   THERAPY DIAG:  Chronic right shoulder pain  Rationale for Evaluation and Treatment: Rehabilitation  ONSET DATE: Jan 2024  SUBJECTIVE:                                                                                                                                                                                       SUBJECTIVE STATEMENT:  Pt reports the R shoulder it is not hurting as bad today, only reporting a 4/10 pain.  Pt states she usually feels good after stretching and therapy.  PERTINENT HISTORY:  Pt reports R shoulder pain that began Jan 2024. Reports that she had more pain in Jan, that maybe her pain has gotten a little better  since then. Pt reports her motion has not been good and is not improving over the past 2 months. Pain is anterior shoulder that feels sharp and radiates to the ear and chest. Denies numbness/tingling sensation. Current pain 5/10; Best: 3/10 Worst: 8/10. Pt reports increased pain with reaching all directions, especially overhead to do her hair, shower, and put her clothes on. Has pain with lifting, pulling, pushing, and when anything puts pressure on her shoulder. 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 Pt reports she rides a recumbent stationary bike about 3x/week and is able to complete this still, and walks 5x/week. Pt is R handed. Pt denies N/V, B&B changes, unexplained weight fluctuation, saddle paresthesia, fever, night sweats, or unrelenting night pain at this time.   PAIN:  Are you having pain? Yes: NPRS scale: 4/10 Pain location: R shoulder Pain description: sharp Aggravating factors: reaching all directions, especially overhead. Has pain with lifting, pulling, pushing, and when anything puts pressure on her shoulder.  Relieving factors: rest  PRECAUTIONS: None  WEIGHT BEARING RESTRICTIONS: No  FALLS:  Has patient fallen in last 6 months? No  LIVING ENVIRONMENT: Lives with: lives with their family Lives in: House/apartment Stairs: Yes: Internal: 12 steps; none and External: 3 steps; can reach both Has following equipment at home: None  OCCUPATION: Full time med tech  PLOF:  Independent  PATIENT GOALS:Get my movement back in my arm  NEXT MD VISIT:   OBJECTIVE:   DIAGNOSTIC FINDINGS:  Pt reported x-[facility] at [facility] with Dr. Landry Mellow- unremarkable  PATIENT SURVEYS:  FOTO 53 goal 67  COGNITION: Overall cognitive status: Within functional limits for tasks assessed     SENSATION: WFL  POSTURE: FHRS, increased thoracic kyphosis  UPPER EXTREMITY ROM:   Active ROM Right eval Left eval  Shoulder flexion 108/142 WNL  Shoulder extension 46 WNL  Shoulder abduction 80/84 WNL  Shoulder adduction    Shoulder internal rotation Apleys R PSIS/42 Apleys T10  Shoulder external rotation Apleys R ear/22 Apleys CTJ  Elbow flexion WNL WNL  Elbow extension WNL WNL  Wrist flexion    Wrist extension    Wrist ulnar deviation    Wrist radial deviation    Wrist pronation    Wrist supination    (Blank rows = not tested) All thoracic mobility WNL - with some tension at end range thoracic ext  UPPER EXTREMITY MMT:  MMT Right eval Left eval  Shoulder flexion 4+ 5  Shoulder extension    Shoulder abduction 4 4+  Shoulder adduction    Shoulder internal rotation 4 4+  Shoulder external rotation 3+ 4+  Middle trapezius 3 4+  Lower trapezius unable 4  Elbow flexion 5 5  Elbow extension 5 5  Wrist flexion    Wrist extension    Wrist ulnar deviation    Wrist radial deviation    Wrist pronation    Wrist supination    Grip strength (lbs)    (Blank rows = not tested)  SHOULDER SPECIAL TESTS:  All impingement tests positive (Neers, HK, painful arc), but generalized pain with all movements  PALPATION:  TTP with trigger points to R UT and pec major/minor; concordant pain at deep palpation over ant joint line   TODAY'S TREATMENT: DATE: 06/15/22  TherEx:  Pulleys AAROM flex x12 abd x12  with cuing throughout for technique Towel across back IR x12  Hooklying AAROM PVC overhead flex 2x12; abd 2x12  Pec  stretch in hooklying with towel along spine 60sec  x2 R open books 2x12 with cuing for set up with good carry over  Sidelying ER x12 Sidelying abd x12  Sidelying ER with 2# DB x12 Sidelying abd with 2# DB x12  Hooklying serratus punch 4# DB x12   Seated scapular retraction/rows at OMEGA, 20#, 2x10   PATIENT EDUCATION:  Education details: Patient was educated on diagnosis, anatomy and pathology involved, prognosis, role of PT, and was given an HEP, demonstrating exercise with proper form following verbal and tactile cues, and was given a paper hand out to continue exercise at home. Pt was educated on and agreed to plan of care.  Person educated: Patient Education method: Explanation, Demonstration, and Handouts Education comprehension: verbalized understanding, returned demonstration, and verbal cues required  HOME EXERCISE PROGRAM: Access Code: [email or web address]  - Seated Shoulder Flexion AAROM with Pulley Behind  - 1-2 x daily - 7 x weekly - 12-20 reps - Seated Shoulder Abduction AAROM with Pulley Behind  - 1-2 x daily - 7 x weekly - 12-20 reps - Standing Shoulder Internal Rotation AAROM with Pulley  - 1-2 x daily - 7 x weekly - 12-20 reps  ASSESSMENT:  CLINICAL IMPRESSION:  Pt responded well to the exercises given today and put forth great effort throughout.  Pt did arrive to clinic late, so session was limited to time constraints, but otherwise pt performed well throughout the time remaining.  Pt able to achieve the performance of her exercises with increased resistance and is performing well.  Pt notes a 2/10 pain upon leaving the clinic which is reduction in pain from beginning of session.   Pt will continue to benefit from skilled therapy to address remaining deficits in order to improve overall QoL and return to PLOF.      OBJECTIVE IMPAIRMENTS: Abnormal gait, decreased activity tolerance, decreased coordination, decreased mobility, decreased ROM, decreased strength, increased fascial restrictions, impaired perceived functional  ability, impaired flexibility, impaired UE functional use, improper body mechanics, postural dysfunction, and pain.   ACTIVITY LIMITATIONS: carrying, lifting, transfers, bathing, dressing, reach over head, and hygiene/grooming  PARTICIPATION LIMITATIONS: meal prep, cleaning, laundry, driving, shopping, community activity, and yard work  PERSONAL FACTORS: Age, Fitness, Past/current experiences, Time since onset of injury/illness/exacerbation, and 3+ comorbidities: HLD, DM2, previous AC, obesity  are also affecting patient's functional outcome.   REHAB POTENTIAL: Good  CLINICAL DECISION MAKING: Evolving/moderate complexity  EVALUATION COMPLEXITY: Moderate   GOALS: Goals reviewed with patient? Yes  SHORT TERM GOALS: Target date: 06/17/22  Pt will be independent with HEP in order to improve strength and balance in order to decrease fall risk and improve function at home and work.  Baseline: HEP given  Goal status: INITIAL   LONG TERM GOALS: Target date: 08/04/22  Patient will increase FOTO score to 53 to demonstrate predicted increase in functional mobility to complete ADLs Baseline: 67 Goal status: INITIAL  2.  Pt will demonstrate full shoulder ROM in order to complete basic ADLs Baseline: see eval Goal status: INITIAL  3.  Pt will decrease worst pain as reported on NPRS by at least 3 points in order to demonstrate clinically significant reduction in pain.  Baseline: 8/10 Goal status: INITIAL   PLAN:  PT FREQUENCY: 1-2x/week  PT DURATION: 8 weeks  PLANNED INTERVENTIONS: Therapeutic exercises, Therapeutic activity, Neuromuscular re-education, Balance training, Gait training, Patient/Family education, Self Care, Joint mobilization, Joint manipulation, DME instructions, Dry Needling, Spinal manipulation, Cryotherapy, Moist heat, Traction, Ultrasound, Ionotophoresis 4mg /ml Dexamethasone, Manual  therapy, and Re-evaluation  PLAN FOR NEXT SESSION: mobility   Nolon Bussing,  PT, DPT Physical Therapist - [facility]  [facility]  06/15/22, 10:54 AM

## 2022-06-20 ENCOUNTER — Encounter: Payer: Managed Care, Other (non HMO) | Admitting: Physical Therapy

## 2022-06-23 ENCOUNTER — Ambulatory Visit: Payer: Managed Care, Other (non HMO)

## 2022-06-23 DIAGNOSIS — M25511 Pain in right shoulder: Secondary | ICD-10-CM | POA: Diagnosis not present

## 2022-06-23 DIAGNOSIS — G8929 Other chronic pain: Secondary | ICD-10-CM

## 2022-06-23 NOTE — Therapy (Signed)
OUTPATIENT PHYSICAL THERAPY SHOULDER TREATMENT   Patient Name: Carrie James MRN: [id number] DOB:[birth year], 62 y.o., female Today's Date: 06/23/2022  END OF SESSION:  PT End of Session - 06/23/22 1048     Visit Number 6    Number of Visits 10    Date for PT Re-Evaluation 08/05/22    Authorization - Visit Number 6    Authorization - Number of Visits 10    Progress Note Due on Visit 10    PT Start Time 1048    PT Stop Time 1130    PT Time Calculation (min) 42 min    Activity Tolerance Patient tolerated treatment well    Behavior During Therapy [facility] for tasks assessed/performed              Past Medical History:  Diagnosis Date   Diabetes mellitus without complication    Fibromyalgia    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    Hyperlipidemia    Obesity    PUD (peptic ulcer disease)    Varicose vein of leg    Past Surgical History:  Procedure Laterality Date   COLONOSCOPY WITH PROPOFOL N/A 02/10/2021   Procedure: COLONOSCOPY WITH PROPOFOL;  Surgeon: Toledo, Boykin Nearing, MD;  Location: [facility] ENDOSCOPY;  Service: Gastroenterology;  Laterality: N/A;  DM   NECK SURGERY  2002, 2005   fusion   Patient Active Problem List   Diagnosis Date Noted   Abnormal ECG 12/15/2014   Uncomplicated asthma 10/29/2014   Adult BMI 30+ 10/29/2014   Cervical disc disorder 10/29/2014   Type 2 diabetes mellitus with complication, with long-term current use of insulin 10/29/2014   Allergic rhinitis 10/29/2014   Gastric ulcer without hemorrhage or perforation 10/29/2014   Other obesity 10/29/2014   Pure hypercholesterolemia 10/29/2014   Headache 05/26/2014   Adhesive capsulitis of shoulder 05/26/2014    PCP: Nemiah Commander MD  REFERRING PROVIDER: Landry Mellow  REFERRING DIAG: R shoulder pain   THERAPY DIAG:  Chronic right shoulder pain  Rationale for Evaluation and Treatment: Rehabilitation  ONSET DATE: Jan 2024  SUBJECTIVE:                                                                                                                                                                                       SUBJECTIVE STATEMENT:  Pt reports she is having 2/10 pain today.  Pt notes she went to pull up on the recliner and hit her arm on the table when bringing the arm up and that caused a sharp pain in the shoulder last night.  It caused a jolt in her arm that lasted for a while.  She woke  up without the jolt/sharp pain however.   PERTINENT HISTORY:  Pt reports R shoulder pain that began Jan 2024. Reports that she had more pain in Jan, that maybe her pain has gotten a little better since then. Pt reports her motion has not been good and is not improving over the past 2 months. Pain is anterior shoulder that feels sharp and radiates to the ear and chest. Denies numbness/tingling sensation. Current pain 5/10; Best: 3/10 Worst: 8/10. Pt reports increased pain with reaching all directions, especially overhead to do her hair, shower, and put her clothes on. Has pain with lifting, pulling, pushing, and when anything puts pressure on her shoulder. 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 Pt reports she rides a recumbent stationary bike about 3x/week and is able to complete this still, and walks 5x/week. Pt is R handed. Pt denies N/V, B&B changes, unexplained weight fluctuation, saddle paresthesia, fever, night sweats, or unrelenting night pain at this time.   PAIN:  Are you having pain? Yes: NPRS scale: 2/10 Pain location: R shoulder Pain description: sharp Aggravating factors: reaching all directions, especially overhead. Has pain with lifting, pulling, pushing, and when anything puts pressure on her shoulder.  Relieving factors: rest  PRECAUTIONS: None  WEIGHT BEARING RESTRICTIONS: No  FALLS:  Has patient fallen in last 6 months? No  LIVING ENVIRONMENT: Lives with: lives with their family Lives in: House/apartment Stairs: Yes:  Internal: 12 steps; none and External: 3 steps; can reach both Has following equipment at home: None  OCCUPATION: Full time med tech  PLOF: Independent  PATIENT GOALS:Get my movement back in my arm  NEXT MD VISIT:   OBJECTIVE:   DIAGNOSTIC FINDINGS:  Pt reported x-ray at [facility] with Dr. Landry Mellow- unremarkable  PATIENT SURVEYS:  FOTO 53 goal 67  COGNITION: Overall cognitive status: Within functional limits for tasks assessed     SENSATION: WFL  POSTURE: FHRS, increased thoracic kyphosis  UPPER EXTREMITY ROM:   Active ROM Right eval Left eval  Shoulder flexion 108/142 WNL  Shoulder extension 46 WNL  Shoulder abduction 80/84 WNL  Shoulder adduction    Shoulder internal rotation Apleys R PSIS/42 Apleys T10  Shoulder external rotation Apleys R ear/22 Apleys CTJ  Elbow flexion WNL WNL  Elbow extension WNL WNL  Wrist flexion    Wrist extension    Wrist ulnar deviation    Wrist radial deviation    Wrist pronation    Wrist supination    (Blank rows = not tested) All thoracic mobility WNL - with some tension at end range thoracic ext  UPPER EXTREMITY MMT:  MMT Right eval Left eval  Shoulder flexion 4+ 5  Shoulder extension    Shoulder abduction 4 4+  Shoulder adduction    Shoulder internal rotation 4 4+  Shoulder external rotation 3+ 4+  Middle trapezius 3 4+  Lower trapezius unable 4  Elbow flexion 5 5  Elbow extension 5 5  Wrist flexion    Wrist extension    Wrist ulnar deviation    Wrist radial deviation    Wrist pronation    Wrist supination    Grip strength (lbs)    (Blank rows = not tested)  SHOULDER SPECIAL TESTS:  All impingement tests positive (Neers, HK, painful arc), but generalized pain with all movements  PALPATION:  TTP with trigger points to R UT and pec major/minor; concordant pain at deep palpation over ant joint line  TODAY'S TREATMENT: DATE: 06/23/22  TherEx:  Pulleys AAROM flex x12 abd x12  with cuing throughout for  technique Towel across back IR x12 Hooklying AAROM PVC overhead flex 2x12; abd 2x12  Hooklying chest press with 7.5# AW attached to PVC, 2x15 Hooklying overhead flexion with 7.5# AW attached to PVC, 2x15  Sidelying ER x12 Sidelying abd x12  Sidelying ER with 2# DB x12 Sidelying abd with 2# DB x12  Hooklying serratus punch 4# DB 2x15  Seated scapular retraction/rows at OMEGA, 20#, 2x10   Trigger Point Dry Needling (TDN), unbilled, 5 min  Education performed with patient regarding potential benefit of TDN. Reviewed precautions and risks with patient. Reviewed special precautions/risks over lung fields which include pneumothorax. Reviewed signs and symptoms of pneumothorax and advised pt to go to ER immediately if these symptoms develop advise them of dry needling treatment. Extensive time spent with pt to ensure full understanding of TDN risks. Pt provided verbal consent to treatment. TDN performed to R UT with 0.30 x 30 single needle placements with local twitch response (LTR). Pistoning technique utilized. Improved pain-free motion following intervention.     PATIENT EDUCATION:  Education details: Patient was educated on diagnosis, anatomy and pathology involved, prognosis, role of PT, and was given an HEP, demonstrating exercise with proper form following verbal and tactile cues, and was given a paper hand out to continue exercise at home. Pt was educated on and agreed to plan of care.  Person educated: Patient Education method: Explanation, Demonstration, and Handouts Education comprehension: verbalized understanding, returned demonstration, and verbal cues required  HOME EXERCISE PROGRAM: Access Code: [email or web address]  - Seated Shoulder Flexion AAROM with Pulley Behind  - 1-2 x daily - 7 x weekly - 12-20 reps - Seated Shoulder Abduction AAROM with Pulley Behind  - 1-2 x daily - 7 x weekly - 12-20 reps - Standing Shoulder Internal Rotation AAROM with Pulley  - 1-2 x daily - 7 x weekly -  12-20 reps  ASSESSMENT:  CLINICAL IMPRESSION:  Pt responded well to the approaches today and put forth great effort throughout the session.  Pt agreeable to dry needling approach in order to alleviate the TP's noted in the UT of the right side.  Pt had multiple muscular twitching in the R UT in both prone and supine position.  Pt with considerable reduction in tissue restrictions following the dry needling.   Pt will continue to benefit from skilled therapy to address remaining deficits in order to improve overall QoL and return to PLOF.        OBJECTIVE IMPAIRMENTS: Abnormal gait, decreased activity tolerance, decreased coordination, decreased mobility, decreased ROM, decreased strength, increased fascial restrictions, impaired perceived functional ability, impaired flexibility, impaired UE functional use, improper body mechanics, postural dysfunction, and pain.   ACTIVITY LIMITATIONS: carrying, lifting, transfers, bathing, dressing, reach over head, and hygiene/grooming  PARTICIPATION LIMITATIONS: meal prep, cleaning, laundry, driving, shopping, community activity, and yard work  PERSONAL FACTORS: Age, Fitness, Past/current experiences, Time since onset of injury/illness/exacerbation, and 3+ comorbidities: HLD, DM2, previous AC, obesity  are also affecting patient's functional outcome.   REHAB POTENTIAL: Good  CLINICAL DECISION MAKING: Evolving/moderate complexity  EVALUATION COMPLEXITY: Moderate   GOALS: Goals reviewed with patient? Yes  SHORT TERM GOALS: Target date: 06/17/22  Pt will be independent with HEP in order to improve strength and balance in order to decrease fall risk and improve function at home and work.  Baseline: HEP given  Goal status: INITIAL   LONG TERM GOALS:  Target date: 08/04/22  Patient will increase FOTO score to 53 to demonstrate predicted increase in functional mobility to complete ADLs Baseline: 67 Goal status: INITIAL  2.  Pt will demonstrate  full shoulder ROM in order to complete basic ADLs Baseline: see eval Goal status: INITIAL  3.  Pt will decrease worst pain as reported on NPRS by at least 3 points in order to demonstrate clinically significant reduction in pain. Baseline: 8/10 Goal status: INITIAL   PLAN:  PT FREQUENCY: 1-2x/week  PT DURATION: 8 weeks  PLANNED INTERVENTIONS: Therapeutic exercises, Therapeutic activity, Neuromuscular re-education, Balance training, Gait training, Patient/Family education, Self Care, Joint mobilization, Joint manipulation, DME instructions, Dry Needling, Spinal manipulation, Cryotherapy, Moist heat, Traction, Ultrasound, Ionotophoresis /ml Dexamethasone, Manual therapy, and Re-evaluation  PLAN FOR NEXT SESSION: shoulder mobility   Nolon Bussing, PT, DPT Physical Therapist -   [facility]  06/23/22, 10:48 AM

## 2022-06-24 ENCOUNTER — Other Ambulatory Visit: Payer: Self-pay | Admitting: Internal Medicine

## 2022-06-24 DIAGNOSIS — Z1231 Encounter for screening mammogram for malignant neoplasm of breast: Secondary | ICD-10-CM

## 2022-06-27 ENCOUNTER — Encounter: Payer: Managed Care, Other (non HMO) | Admitting: Physical Therapy

## 2022-06-30 ENCOUNTER — Ambulatory Visit: Payer: Managed Care, Other (non HMO)

## 2022-06-30 DIAGNOSIS — M25511 Pain in right shoulder: Secondary | ICD-10-CM | POA: Diagnosis not present

## 2022-06-30 DIAGNOSIS — G8929 Other chronic pain: Secondary | ICD-10-CM

## 2022-06-30 NOTE — Therapy (Signed)
OUTPATIENT PHYSICAL THERAPY SHOULDER TREATMENT   Patient Name: Carrie James MRN: [id number] DOB:Aug 24, 1960, 62 y.o., female Today's Date: 06/30/2022  END OF SESSION:  PT End of Session - 06/30/22 1050     Visit Number 7    Number of Visits 10    Date for PT Re-Evaluation 08/05/22    Authorization - Visit Number 7    Authorization - Number of Visits 10    Progress Note Due on Visit 10    PT Start Time 1050    PT Stop Time 1130    PT Time Calculation (min) 40 min    Activity Tolerance Patient tolerated treatment well    Behavior During Therapy [facility] for tasks assessed/performed              Past Medical History:  Diagnosis Date   Diabetes mellitus without complication    Fibromyalgia    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    Hyperlipidemia    Obesity    PUD (peptic ulcer disease)    Varicose vein of leg    Past Surgical History:  Procedure Laterality Date   COLONOSCOPY WITH PROPOFOL N/A 02/10/2021   Procedure: COLONOSCOPY WITH PROPOFOL;  Surgeon: Toledo, Boykin Nearing, MD;  Location: [facility] ENDOSCOPY;  Service: Gastroenterology;  Laterality: N/A;  DM   NECK SURGERY  2002, 2005   fusion   Patient Active Problem List   Diagnosis Date Noted   Abnormal ECG 12/15/2014   Uncomplicated asthma 10/29/2014   Adult BMI 30+ 10/29/2014   Cervical disc disorder 10/29/2014   Type 2 diabetes mellitus with complication, with long-term current use of insulin 10/29/2014   Allergic rhinitis 10/29/2014   Gastric ulcer without hemorrhage or perforation 10/29/2014   Other obesity 10/29/2014   Pure hypercholesterolemia 10/29/2014   Headache 05/26/2014   Adhesive capsulitis of shoulder 05/26/2014    PCP: Nemiah Commander MD  REFERRING PROVIDER: Landry Mellow  REFERRING DIAG: R shoulder pain   THERAPY DIAG:  Chronic right shoulder pain  Rationale for Evaluation and Treatment: Rehabilitation  ONSET DATE: Jan 2024  SUBJECTIVE:                                                                                                                                                                                       SUBJECTIVE STATEMENT: Patient reports 3/10 pain today. Reports waking up in the night with pain. Able to do her exercises and stretches this week 2-3 times.    PERTINENT HISTORY:  Pt reports R shoulder pain that began Jan 2024. Reports that she had more pain in Jan, that maybe her pain has gotten a little better since then. Pt  reports her motion has not been good and is not improving over the past 2 months. Pain is anterior shoulder that feels sharp and radiates to the ear and chest. Denies numbness/tingling sensation. Current pain 5/10; Best: 3/10 Worst: 8/10. Pt reports increased pain with reaching all directions, especially overhead to do her hair, shower, and put her clothes on. Has pain with lifting, pulling, pushing, and when anything puts pressure on her shoulder. 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 Pt reports she rides a recumbent stationary bike about 3x/week and is able to complete this still, and walks 5x/week. Pt is R handed. Pt denies N/V, B&B changes, unexplained weight fluctuation, saddle paresthesia, fever, night sweats, or unrelenting night pain at this time.   PAIN:  Are you having pain? Yes: NPRS scale: 2/10 Pain location: R shoulder Pain description: sharp Aggravating factors: reaching all directions, especially overhead. Has pain with lifting, pulling, pushing, and when anything puts pressure on her shoulder.  Relieving factors: rest  PRECAUTIONS: None  WEIGHT BEARING RESTRICTIONS: No  FALLS:  Has patient fallen in last 6 months? No  LIVING ENVIRONMENT: Lives with: lives with their family Lives in: House/apartment Stairs: Yes: Internal: 12 steps; none and External: 3 steps; can reach both Has following equipment at home: None  OCCUPATION: Full time med tech  PLOF: Independent  PATIENT  GOALS:Get my movement back in my arm  NEXT MD VISIT:   OBJECTIVE:   DIAGNOSTIC FINDINGS:  Pt reported x-ray at [facility] with Dr. Landry Mellow- unremarkable  PATIENT SURVEYS:  FOTO 53 goal 67  COGNITION: Overall cognitive status: Within functional limits for tasks assessed     SENSATION: WFL  POSTURE: FHRS, increased thoracic kyphosis  UPPER EXTREMITY ROM:   Active ROM Right eval Left eval  Shoulder flexion 108/142 WNL  Shoulder extension 46 WNL  Shoulder abduction 80/84 WNL  Shoulder adduction    Shoulder internal rotation Apleys R PSIS/42 Apleys T10  Shoulder external rotation Apleys R ear/22 Apleys CTJ  Elbow flexion WNL WNL  Elbow extension WNL WNL  Wrist flexion    Wrist extension    Wrist ulnar deviation    Wrist radial deviation    Wrist pronation    Wrist supination    (Blank rows = not tested) All thoracic mobility WNL - with some tension at end range thoracic ext  UPPER EXTREMITY MMT:  MMT Right eval Left eval  Shoulder flexion 4+ 5  Shoulder extension    Shoulder abduction 4 4+  Shoulder adduction    Shoulder internal rotation 4 4+  Shoulder external rotation 3+ 4+  Middle trapezius 3 4+  Lower trapezius unable 4  Elbow flexion 5 5  Elbow extension 5 5  Wrist flexion    Wrist extension    Wrist ulnar deviation    Wrist radial deviation    Wrist pronation    Wrist supination    Grip strength (lbs)    (Blank rows = not tested)  SHOULDER SPECIAL TESTS:  All impingement tests positive (Neers, HK, painful arc), but generalized pain with all movements  PALPATION:  TTP with trigger points to R UT and pec major/minor; concordant pain at deep palpation over ant joint line   TODAY'S TREATMENT: DATE: 06/30/22  TherEx:  Pulleys AAROM flex x12 abd x12  with cuing throughout for technique Seated row at [facility] 15# 2 x 10  Doorway pec stretch x 10 for 10 sec hold Y  on wall with lift off x 15 Shoulder flexion and abduction stretch on wall x 10  each direction with 10 second hold GTB horizontal abduction, diagonal, IR, and ER x 10 each bilaterally Standing AAROM PVC flexion and abduction x 10 each with 3 second hold  Wall push up x 15    PATIENT EDUCATION:  Education details: Patient was educated on diagnosis, anatomy and pathology involved, prognosis, role of PT, and was given an HEP, demonstrating exercise with proper form following verbal and tactile cues, and was given a paper hand out to continue exercise at home. Pt was educated on and agreed to plan of care.  Person educated: Patient Education method: Explanation, Demonstration, and Handouts Education comprehension: verbalized understanding, returned demonstration, and verbal cues required  HOME EXERCISE PROGRAM: Access Code: [email or web address]  - Seated Shoulder Flexion AAROM with Pulley Behind  - 1-2 x daily - 7 x weekly - 12-20 reps - Seated Shoulder Abduction AAROM with Pulley Behind  - 1-2 x daily - 7 x weekly - 12-20 reps - Standing Shoulder Internal Rotation AAROM with Pulley  - 1-2 x daily - 7 x weekly - 12-20 reps  ASSESSMENT:  CLINICAL IMPRESSION: Pt responded well to newly introduced exercises this session and motivated to continue improving. Patient reports improvement in pain and stiffness at end of session. Continues to be demonstrates limited ROM in R shoulder primarily with ER and IR. Notable pectoral tightness on R which could attribute to limited ROM along with weakness. Patient will continue to benefit from skilled therapy to address remaining deficits in order to improve quality of life and return to PLOF.     OBJECTIVE IMPAIRMENTS: Abnormal gait, decreased activity tolerance, decreased coordination, decreased mobility, decreased ROM, decreased strength, increased fascial restrictions, impaired perceived functional ability, impaired flexibility, impaired UE functional use, improper body mechanics, postural dysfunction, and pain.   ACTIVITY LIMITATIONS:  carrying, lifting, transfers, bathing, dressing, reach over head, and hygiene/grooming  PARTICIPATION LIMITATIONS: meal prep, cleaning, laundry, driving, shopping, community activity, and yard work  PERSONAL FACTORS: Age, Fitness, Past/current experiences, Time since onset of injury/illness/exacerbation, and 3+ comorbidities: HLD, DM2, previous AC, obesity  are also affecting patient's functional outcome.   REHAB POTENTIAL: Good  CLINICAL DECISION MAKING: Evolving/moderate complexity  EVALUATION COMPLEXITY: Moderate   GOALS: Goals reviewed with patient? Yes  SHORT TERM GOALS: Target date: 06/17/22  Pt will be independent with HEP in order to improve strength and balance in order to decrease fall risk and improve function at home and work.  Baseline: HEP given  Goal status: INITIAL   LONG TERM GOALS: Target date: 08/04/22  Patient will increase FOTO score to 53 to demonstrate predicted increase in functional mobility to complete ADLs Baseline: 67 Goal status: INITIAL  2.  Pt will demonstrate full shoulder ROM in order to complete basic ADLs Baseline: see eval Goal status: INITIAL  3.  Pt will decrease worst pain as reported on NPRS by at least 3 points in order to demonstrate clinically significant reduction in pain. Baseline: 8/10 Goal status: INITIAL   PLAN:  PT FREQUENCY: 1-2x/week  PT DURATION: 8 weeks  PLANNED INTERVENTIONS: Therapeutic exercises, Therapeutic activity, Neuromuscular re-education, Balance training, Gait training, Patient/Family education, Self Care, Joint mobilization, Joint manipulation, DME instructions, Dry Needling, Spinal manipulation, Cryotherapy, Moist heat, Traction, Ultrasound, Ionotophoresis 4mg /ml Dexamethasone, Manual therapy, and Re-evaluation  PLAN FOR NEXT SESSION: shoulder mobility   Nolon Bussing, PT, DPT Physical Therapist - [facility]  [facility]  06/30/22, 11:44  AM

## 2022-07-05 ENCOUNTER — Ambulatory Visit: Payer: Managed Care, Other (non HMO)

## 2022-07-13 ENCOUNTER — Ambulatory Visit: Payer: Managed Care, Other (non HMO) | Attending: Sports Medicine

## 2022-07-13 DIAGNOSIS — G8929 Other chronic pain: Secondary | ICD-10-CM | POA: Insufficient documentation

## 2022-07-13 DIAGNOSIS — M25511 Pain in right shoulder: Secondary | ICD-10-CM | POA: Diagnosis present

## 2022-07-13 NOTE — Therapy (Signed)
OUTPATIENT PHYSICAL THERAPY SHOULDER TREATMENT   Patient Name: Carrie James MRN: [id number] DOB:13-Apr-1960, 62 y.o., female Today's Date: 07/13/2022  END OF SESSION:  PT End of Session - 07/13/22 1130     Visit Number 8    Number of Visits 10    Date for PT Re-Evaluation 08/05/22    Authorization - Visit Number 8    Authorization - Number of Visits 10    Progress Note Due on Visit 10    PT Start Time 1303    PT Stop Time 1345    PT Time Calculation (min) 42 min    Activity Tolerance Patient tolerated treatment well    Behavior During Therapy [facility] for tasks assessed/performed               Past Medical History:  Diagnosis Date   Diabetes mellitus without complication (HCC)    Fibromyalgia    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    Hyperlipidemia    Obesity    PUD (peptic ulcer disease)    Varicose vein of leg    Past Surgical History:  Procedure Laterality Date   COLONOSCOPY WITH PROPOFOL N/A 02/10/2021   Procedure: COLONOSCOPY WITH PROPOFOL;  Surgeon: Toledo, Boykin Nearing, MD;  Location: [facility] ENDOSCOPY;  Service: Gastroenterology;  Laterality: N/A;  DM   NECK SURGERY  2002, 2005   fusion   Patient Active Problem List   Diagnosis Date Noted   Abnormal ECG 12/15/2014   Uncomplicated asthma 10/29/2014   Adult BMI 30+ 10/29/2014   Cervical disc disorder 10/29/2014   Type 2 diabetes mellitus with complication, with long-term current use of insulin (HCC) 10/29/2014   Allergic rhinitis 10/29/2014   Gastric ulcer without hemorrhage or perforation 10/29/2014   Other obesity 10/29/2014   Pure hypercholesterolemia 10/29/2014   Headache 05/26/2014   Adhesive capsulitis of shoulder 05/26/2014    PCP: Nemiah Commander MD  REFERRING PROVIDER: Landry Mellow  REFERRING DIAG: R shoulder pain   THERAPY DIAG:  Chronic right shoulder pain  Rationale for Evaluation and Treatment: Rehabilitation  ONSET DATE: Jan 2024  SUBJECTIVE:                                                                                                                                                                                       SUBJECTIVE STATEMENT:  Pt reports she is doing worse today due to sleeping on the R side.  Pt notes she has been waking up with numbness in the R pinky as well, but it goes away after a few minutes of rubbing the hand.    PERTINENT HISTORY:  Pt reports R shoulder pain that began  Jan 2024. Reports that she had more pain in Jan, that maybe her pain has gotten a little better since then. Pt reports her motion has not been good and is not improving over the past 2 months. Pain is anterior shoulder that feels sharp and radiates to the ear and chest. Denies numbness/tingling sensation. Current pain 5/10; Best: 3/10 Worst: 8/10. Pt reports increased pain with reaching all directions, especially overhead to do her hair, shower, and put her clothes on. Has pain with lifting, pulling, pushing, and when anything puts pressure on her shoulder. 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 Pt reports she rides a recumbent stationary bike about 3x/week and is able to complete this still, and walks 5x/week. Pt is R handed. Pt denies N/V, B&B changes, unexplained weight fluctuation, saddle paresthesia, fever, night sweats, or unrelenting night pain at this time.   PAIN:  Are you having pain? Yes: NPRS scale: 2/10 Pain location: R shoulder Pain description: sharp Aggravating factors: reaching all directions, especially overhead. Has pain with lifting, pulling, pushing, and when anything puts pressure on her shoulder.  Relieving factors: rest  PRECAUTIONS: None  WEIGHT BEARING RESTRICTIONS: No  FALLS:  Has patient fallen in last 6 months? No  LIVING ENVIRONMENT: Lives with: lives with their family Lives in: House/apartment Stairs: Yes: Internal: 12 steps; none and External: 3 steps; can reach both Has following equipment at home:  None  OCCUPATION: Full time med tech  PLOF: Independent  PATIENT GOALS:Get my movement back in my arm  NEXT MD VISIT:   OBJECTIVE:   DIAGNOSTIC FINDINGS:  Pt reported x-ray at [facility] with Dr. Landry Mellow- unremarkable  PATIENT SURVEYS:  FOTO 53 goal 67  COGNITION: Overall cognitive status: Within functional limits for tasks assessed     SENSATION: WFL  POSTURE: FHRS, increased thoracic kyphosis  UPPER EXTREMITY ROM:   Active ROM Right eval Left eval  Shoulder flexion 108/142 WNL  Shoulder extension 46 WNL  Shoulder abduction 80/84 WNL  Shoulder adduction    Shoulder internal rotation Apleys R PSIS/42 Apleys T10  Shoulder external rotation Apleys R ear/22 Apleys CTJ  Elbow flexion WNL WNL  Elbow extension WNL WNL  Wrist flexion    Wrist extension    Wrist ulnar deviation    Wrist radial deviation    Wrist pronation    Wrist supination    (Blank rows = not tested) All thoracic mobility WNL - with some tension at end range thoracic ext  UPPER EXTREMITY MMT:  MMT Right eval Left eval  Shoulder flexion 4+ 5  Shoulder extension    Shoulder abduction 4 4+  Shoulder adduction    Shoulder internal rotation 4 4+  Shoulder external rotation 3+ 4+  Middle trapezius 3 4+  Lower trapezius unable 4  Elbow flexion 5 5  Elbow extension 5 5  Wrist flexion    Wrist extension    Wrist ulnar deviation    Wrist radial deviation    Wrist pronation    Wrist supination    Grip strength (lbs)    (Blank rows = not tested)  SHOULDER SPECIAL TESTS:  All impingement tests positive (Neers, HK, painful arc), but generalized pain with all movements  PALPATION:  TTP with trigger points to R UT and pec major/minor; concordant pain at deep palpation over ant joint line   TODAY'S TREATMENT: DATE: 07/13/22  TherEx:  UBE, Level 3, 3 min forward/3 min backward Standing  ulnar nerve glides at wall for reduction of radicular symptoms Standing wall slides, 2x10 into  increased flexion of the R shoulder Pulleys AAROM flex x12 abd x12  with cuing throughout for technique Seated row at [facility] 15# 2 x 10  Seated lat pulldown, 15# x10, 25#, x10 with some pain in the R UT   Manual:  Supine AP mobilization glides, grades I-II to the R shoulder for pain modulation, 30 sec bouts Supine inferior mobilization glides, grades I-II to the R shoulder for pain modulation, 30 sec bouts Supine long arc distraction of the R shoulder for pain relief and increased joint spacing, 30 sec bouts Supine STM to cervical region to increase extensibility of the paraspinals Supine UT/Levator stretch, 30 sec bouts to increase tissue extensibility of the cervical region   Trigger Point Dry Needling (TDN), unbilled, 3 min Education performed with patient regarding potential benefit of TDN. Reviewed precautions and risks with patient. Reviewed special precautions/risks over lung fields which include pneumothorax. Reviewed signs and symptoms of pneumothorax and advised pt to go to ER immediately if these symptoms develop advise them of dry needling treatment. Extensive time spent with pt to ensure full understanding of TDN risks. Pt provided verbal consent to treatment. TDN performed to R UT with 0.30 x 30 single needle placements with local twitch response (LTR). Pistoning technique utilized. Improved pain-free motion following intervention.     PATIENT EDUCATION:  Education details: Patient was educated on diagnosis, anatomy and pathology involved, prognosis, role of PT, and was given an HEP, demonstrating exercise with proper form following verbal and tactile cues, and was given a paper hand out to continue exercise at home. Pt was educated on and agreed to plan of care.  Person educated: Patient Education method: Explanation, Demonstration, and Handouts Education comprehension: verbalized understanding, returned demonstration, and verbal cues required  HOME EXERCISE PROGRAM: Access  Code: [email or web address]  - Seated Shoulder Flexion AAROM with Pulley Behind  - 1-2 x daily - 7 x weekly - 12-20 reps - Seated Shoulder Abduction AAROM with Pulley Behind  - 1-2 x daily - 7 x weekly - 12-20 reps - Standing Shoulder Internal Rotation AAROM with Pulley  - 1-2 x daily - 7 x weekly - 12-20 reps  ASSESSMENT:  CLINICAL IMPRESSION:  Pt continues to put forth good effort throuhgout the session and is doing well with current HEP.  Pt still having significant limitations with ER, flexion, and abduction of the R shoulder with active ROM.  Pt will continue to benefit from manual therapy to improve ROM of the R shoulder.  Pt would likely benefit from continued needling after assessing RTC musculature for TP's that may be contributing to overall pain levels.   Pt will continue to benefit from skilled therapy to address remaining deficits in order to improve overall QoL and return to PLOF.       OBJECTIVE IMPAIRMENTS: Abnormal gait, decreased activity tolerance, decreased coordination, decreased mobility, decreased ROM, decreased strength, increased fascial restrictions, impaired perceived functional ability, impaired flexibility, impaired UE functional use, improper body mechanics, postural dysfunction, and pain.   ACTIVITY LIMITATIONS: carrying, lifting, transfers, bathing, dressing, reach over head, and hygiene/grooming  PARTICIPATION LIMITATIONS: meal prep, cleaning, laundry, driving, shopping, community activity, and yard work  PERSONAL FACTORS: Age, Fitness, Past/current experiences, Time since onset of injury/illness/exacerbation, and 3+ comorbidities: HLD, DM2, previous AC, obesity  are also affecting patient's functional outcome.   REHAB POTENTIAL: Good  CLINICAL DECISION MAKING: Evolving/moderate complexity  EVALUATION COMPLEXITY: Moderate  GOALS: Goals reviewed with patient? Yes  SHORT TERM GOALS: Target date: 06/17/22  Pt will be independent with HEP in order to improve  strength and balance in order to decrease fall risk and improve function at home and work.  Baseline: HEP given  Goal status: INITIAL   LONG TERM GOALS: Target date: 08/04/22  Patient will increase FOTO score to 53 to demonstrate predicted increase in functional mobility to complete ADLs Baseline: 67 Goal status: INITIAL  2.  Pt will demonstrate full shoulder ROM in order to complete basic ADLs Baseline: see eval Goal status: INITIAL  3.  Pt will decrease worst pain as reported on NPRS by at least 3 points in order to demonstrate clinically significant reduction in pain. Baseline: 8/10 Goal status: INITIAL   PLAN:  PT FREQUENCY: 1-2x/week  PT DURATION: 8 weeks  PLANNED INTERVENTIONS: Therapeutic exercises, Therapeutic activity, Neuromuscular re-education, Balance training, Gait training, Patient/Family education, Self Care, Joint mobilization, Joint manipulation, DME instructions, Dry Needling, Spinal manipulation, Cryotherapy, Moist heat, Traction, Ultrasound, Ionotophoresis 4mg /ml Dexamethasone, Manual therapy, and Re-evaluation  PLAN FOR NEXT SESSION: shoulder mobility   Nolon Bussing, PT, DPT Physical Therapist - [facility]  [facility]  07/13/22, 5:31 PM

## 2022-07-19 ENCOUNTER — Ambulatory Visit
Admission: RE | Admit: 2022-07-19 | Discharge: 2022-07-19 | Disposition: A | Discharge status: Home / Self Care | Payer: Managed Care, Other (non HMO) | Source: Ambulatory Visit | Attending: Internal Medicine | Admitting: Internal Medicine

## 2022-07-19 DIAGNOSIS — Z1231 Encounter for screening mammogram for malignant neoplasm of breast: Secondary | ICD-10-CM | POA: Diagnosis present

## 2022-07-21 ENCOUNTER — Ambulatory Visit: Payer: Managed Care, Other (non HMO)

## 2022-07-21 DIAGNOSIS — G8929 Other chronic pain: Secondary | ICD-10-CM

## 2022-07-21 DIAGNOSIS — M25511 Pain in right shoulder: Secondary | ICD-10-CM | POA: Diagnosis not present

## 2022-07-21 NOTE — Therapy (Signed)
OUTPATIENT PHYSICAL THERAPY SHOULDER TREATMENT   Patient Name: Carrie James MRN: [id number] DOB:[birth year], 62 y.o., female Today's Date: 07/21/2022  END OF SESSION:  PT End of Session - 07/21/22 1311     Visit Number 9    Number of Visits 10    Date for PT Re-Evaluation 08/05/22    Authorization - Visit Number 9    Authorization - Number of Visits 10    Progress Note Due on Visit 10    PT Start Time 1306    PT Stop Time 1345    PT Time Calculation (min) 39 min    Activity Tolerance Patient tolerated treatment well    Behavior During Therapy [facility] for tasks assessed/performed               Past Medical History:  Diagnosis Date   Diabetes mellitus without complication (HCC)    Fibromyalgia    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    Hyperlipidemia    Obesity    PUD (peptic ulcer disease)    Varicose vein of leg    Past Surgical History:  Procedure Laterality Date   COLONOSCOPY WITH PROPOFOL N/A 02/10/2021   Procedure: COLONOSCOPY WITH PROPOFOL;  Surgeon: Toledo, Boykin Nearing, MD;  Location: [facility] ENDOSCOPY;  Service: Gastroenterology;  Laterality: N/A;  DM   NECK SURGERY  2002, 2005   fusion   Patient Active Problem List   Diagnosis Date Noted   Abnormal ECG 12/15/2014   Uncomplicated asthma 10/29/2014   Adult BMI 30+ 10/29/2014   Cervical disc disorder 10/29/2014   Type 2 diabetes mellitus with complication, with long-term current use of insulin (HCC) 10/29/2014   Allergic rhinitis 10/29/2014   Gastric ulcer without hemorrhage or perforation 10/29/2014   Other obesity 10/29/2014   Pure hypercholesterolemia 10/29/2014   Headache 05/26/2014   Adhesive capsulitis of shoulder 05/26/2014    PCP: Nemiah Commander MD  REFERRING PROVIDER: Landry Mellow  REFERRING DIAG: R shoulder pain   THERAPY DIAG:  Chronic right shoulder pain  Rationale for Evaluation and Treatment: Rehabilitation  ONSET DATE: Jan 2024  SUBJECTIVE:                                                                                                                                                                                       SUBJECTIVE STATEMENT:  Pt reports she had a cortisone injection yesterday.  Pt notes that she is experiencing less pain, however her range is still limited.  Pt notes that she has less pain if the arm is not moving, but she has not been sleeping or doing her exercises due to the pain levels recently.  Pt  notes she was prescribed muscle relaxer's, so she is hopeful that it will allow her to rest and move better.    PERTINENT HISTORY:  Pt reports R shoulder pain that began Jan 2024. Reports that she had more pain in Jan, that maybe her pain has gotten a little better since then. Pt reports her motion has not been good and is not improving over the past 2 months. Pain is anterior shoulder that feels sharp and radiates to the ear and chest. Denies numbness/tingling sensation. Current pain 5/10; Best: 3/10 Worst: 8/10. Pt reports increased pain with reaching all directions, especially overhead to do her hair, shower, and put her clothes on. Has pain with lifting, pulling, pushing, and when anything puts pressure on her shoulder. 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 Pt reports she rides a recumbent stationary bike about 3x/week and is able to complete this still, and walks 5x/week. Pt is R handed. Pt denies N/V, B&B changes, unexplained weight fluctuation, saddle paresthesia, fever, night sweats, or unrelenting night pain at this time.   PAIN:  Are you having pain? Yes: NPRS scale: 2-3/10 Pain location: R shoulder Pain description: sharp Aggravating factors: reaching all directions, especially overhead. Has pain with lifting, pulling, pushing, and when anything puts pressure on her shoulder.  Relieving factors: rest  PRECAUTIONS: None  WEIGHT BEARING RESTRICTIONS: No  FALLS:  Has patient fallen in last 6 months?  No  LIVING ENVIRONMENT: Lives with: lives with their family Lives in: House/apartment Stairs: Yes: Internal: 12 steps; none and External: 3 steps; can reach both Has following equipment at home: None  OCCUPATION: Full time med tech  PLOF: Independent  PATIENT GOALS:Get my movement back in my arm  NEXT MD VISIT:   OBJECTIVE:   DIAGNOSTIC FINDINGS:  Pt reported x-ray at [facility] with Dr. Landry Mellow- unremarkable  PATIENT SURVEYS:  FOTO 53 goal 67  COGNITION: Overall cognitive status: Within functional limits for tasks assessed     SENSATION: WFL  POSTURE: FHRS, increased thoracic kyphosis  UPPER EXTREMITY ROM:   Active ROM Right eval Left eval  Shoulder flexion 108/142 WNL  Shoulder extension 46 WNL  Shoulder abduction 80/84 WNL  Shoulder adduction    Shoulder internal rotation Apleys R PSIS/42 Apleys T10  Shoulder external rotation Apleys R ear/22 Apleys CTJ  Elbow flexion WNL WNL  Elbow extension WNL WNL  Wrist flexion    Wrist extension    Wrist ulnar deviation    Wrist radial deviation    Wrist pronation    Wrist supination    (Blank rows = not tested) All thoracic mobility WNL - with some tension at end range thoracic ext  UPPER EXTREMITY MMT:  MMT Right eval Left eval  Shoulder flexion 4+ 5  Shoulder extension    Shoulder abduction 4 4+  Shoulder adduction    Shoulder internal rotation 4 4+  Shoulder external rotation 3+ 4+  Middle trapezius 3 4+  Lower trapezius unable 4  Elbow flexion 5 5  Elbow extension 5 5  Wrist flexion    Wrist extension    Wrist ulnar deviation    Wrist radial deviation    Wrist pronation    Wrist supination    Grip strength (lbs)    (Blank rows = not tested)  SHOULDER SPECIAL TESTS:  All impingement tests positive (Neers, HK, painful arc), but generalized pain with all movements  PALPATION:  TTP with trigger points  to R UT and pec major/minor; concordant pain at deep palpation over ant joint  line   TODAY'S TREATMENT: DATE: 07/21/22   TherEx:  Sidelying right shoulder ER with 2# dumbbell, 2x10, with 2 sec hold at peak of ER Sidelying R shoulder abduction with 2# dumbbell, 2x10, with 2 sec hold at peak of abduction  UBE, Level 3, 3 min forward/3 min backward Standing ulnar nerve glides at wall for reduction of radicular symptoms Standing wall slides, 2x10 into increased flexion of the R shoulder Pulleys AAROM flex x12 abd x12  with cuing throughout for technique    Manual:  Supine AP mobilization glides, grades I-II to the R shoulder for pain modulation, 30 sec bouts Supine inferior mobilization glides, grades I-II to the R shoulder for pain modulation, 30 sec bouts Supine long arc distraction of the R shoulder for pain relief and increased joint spacing, 30 sec bouts Supine STM to cervical region to increase extensibility of the paraspinals Supine UT/Levator stretch, 30 sec bouts to increase tissue extensibility of the cervical region      PATIENT EDUCATION:  Education details: Patient was educated on diagnosis, anatomy and pathology involved, prognosis, role of PT, and was given an HEP, demonstrating exercise with proper form following verbal and tactile cues, and was given a paper hand out to continue exercise at home. Pt was educated on and agreed to plan of care.  Person educated: Patient Education method: Explanation, Demonstration, and Handouts Education comprehension: verbalized understanding, returned demonstration, and verbal cues required  HOME EXERCISE PROGRAM: Access Code: [email or web address]  - Seated Shoulder Flexion AAROM with Pulley Behind  - 1-2 x daily - 7 x weekly - 12-20 reps - Seated Shoulder Abduction AAROM with Pulley Behind  - 1-2 x daily - 7 x weekly - 12-20 reps - Standing Shoulder Internal Rotation AAROM with Pulley  - 1-2 x daily - 7 x weekly - 12-20 reps - Standing Median Nerve Glide  - 1 x daily - 7 x weekly - 3 sets - 10 reps - Standing  Ulnar Nerve Glide  - 1 x daily - 7 x weekly - 3 sets - 10 reps  ASSESSMENT:  CLINICAL IMPRESSION:  Pt continues to demonstrate lack of ROM in the R shoulder even following cortisone injection.  Pt is improving in strength however and will continue to benefit from increased resistance during exercises and performing strength at end range of motion.  Pt still has significant TP's noted in the distal biceps and deltoid region as well.  Pt will continue to perform exercises as part of HEP in order to resolve all complications moving forward.   Pt will continue to benefit from skilled therapy to address remaining deficits in order to improve overall QoL and return to PLOF.        OBJECTIVE IMPAIRMENTS: Abnormal gait, decreased activity tolerance, decreased coordination, decreased mobility, decreased ROM, decreased strength, increased fascial restrictions, impaired perceived functional ability, impaired flexibility, impaired UE functional use, improper body mechanics, postural dysfunction, and pain.   ACTIVITY LIMITATIONS: carrying, lifting, transfers, bathing, dressing, reach over head, and hygiene/grooming  PARTICIPATION LIMITATIONS: meal prep, cleaning, laundry, driving, shopping, community activity, and yard work  PERSONAL FACTORS: Age, Fitness, Past/current experiences, Time since onset of injury/illness/exacerbation, and 3+ comorbidities: HLD, DM2, previous AC, obesity  are also affecting patient's functional outcome.   REHAB POTENTIAL: Good  CLINICAL DECISION MAKING: Evolving/moderate complexity  EVALUATION COMPLEXITY: Moderate   GOALS: Goals reviewed with patient? Yes  SHORT TERM GOALS: Target  date: 06/17/22  Pt will be independent with HEP in order to improve strength and balance in order to decrease fall risk and improve function at home and work.  Baseline: HEP given  Goal status: INITIAL   LONG TERM GOALS: Target date: 08/04/22  Patient will increase FOTO score to 53 to  demonstrate predicted increase in functional mobility to complete ADLs Baseline: 67 Goal status: INITIAL  2.  Pt will demonstrate full shoulder ROM in order to complete basic ADLs Baseline: see eval Goal status: INITIAL  3.  Pt will decrease worst pain as reported on NPRS by at least 3 points in order to demonstrate clinically significant reduction in pain. Baseline: 8/10 Goal status: INITIAL   PLAN:  PT FREQUENCY: 1-2x/week  PT DURATION: 8 weeks  PLANNED INTERVENTIONS: Therapeutic exercises, Therapeutic activity, Neuromuscular re-education, Balance training, Gait training, Patient/Family education, Self Care, Joint mobilization, Joint manipulation, DME instructions, Dry Needling, Spinal manipulation, Cryotherapy, Moist heat, Traction, Ultrasound, Ionotophoresis 4mg /ml Dexamethasone, Manual therapy, and Re-evaluation  PLAN FOR NEXT SESSION: shoulder mobility   Nolon Bussing, PT, DPT Physical Therapist - [facility]  [facility]  07/21/22, 3:58 PM

## 2022-07-26 ENCOUNTER — Ambulatory Visit: Payer: Managed Care, Other (non HMO)

## 2022-07-26 DIAGNOSIS — M25511 Pain in right shoulder: Secondary | ICD-10-CM | POA: Diagnosis not present

## 2022-07-26 DIAGNOSIS — G8929 Other chronic pain: Secondary | ICD-10-CM

## 2022-07-26 NOTE — Therapy (Signed)
OUTPATIENT PHYSICAL THERAPY SHOULDER TREATMENT/PHYSICAL THERAPY PROGRESS NOTE/DISCHARGE   Dates of reporting period  05/18/22   to   07/26/22    Patient Name: Carrie James MRN: [id number] DOB:01/27/1961, 62 y.o., female Today's Date: 07/26/2022  END OF SESSION:  PT End of Session - 07/26/22 1306     Visit Number 10    Number of Visits 10    Date for PT Re-Evaluation 08/05/22    Authorization - Visit Number 10    Authorization - Number of Visits 10    Progress Note Due on Visit 10    PT Start Time 1305    PT Stop Time 1345    PT Time Calculation (min) 40 min    Activity Tolerance Patient tolerated treatment well    Behavior During Therapy [facility] for tasks assessed/performed               Past Medical History:  Diagnosis Date   Diabetes mellitus without complication (HCC)    Fibromyalgia    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    Hyperlipidemia    Obesity    PUD (peptic ulcer disease)    Varicose vein of leg    Past Surgical History:  Procedure Laterality Date   COLONOSCOPY WITH PROPOFOL N/A 02/10/2021   Procedure: COLONOSCOPY WITH PROPOFOL;  Surgeon: Toledo, Boykin Nearing, MD;  Location: [facility] ENDOSCOPY;  Service: Gastroenterology;  Laterality: N/A;  DM   NECK SURGERY  2002, 2005   fusion   Patient Active Problem List   Diagnosis Date Noted   Abnormal ECG 12/15/2014   Uncomplicated asthma 10/29/2014   Adult BMI 30+ 10/29/2014   Cervical disc disorder 10/29/2014   Type 2 diabetes mellitus with complication, with long-term current use of insulin (HCC) 10/29/2014   Allergic rhinitis 10/29/2014   Gastric ulcer without hemorrhage or perforation 10/29/2014   Other obesity 10/29/2014   Pure hypercholesterolemia 10/29/2014   Headache 05/26/2014   Adhesive capsulitis of shoulder 05/26/2014    PCP: Nemiah Commander MD  REFERRING PROVIDER: Landry Mellow  REFERRING DIAG: R shoulder pain   THERAPY DIAG:  Chronic right shoulder pain  Rationale for Evaluation and Treatment:  Rehabilitation  ONSET DATE: Jan 2024  SUBJECTIVE:                                                                                                                                                                                      SUBJECTIVE STATEMENT:  Pt reports she had a cortisone injection yesterday.  Pt notes that she is experiencing less pain, however her range is still limited.  Pt notes that she has less pain if the arm is not moving,  but she has not been sleeping or doing her exercises due to the pain levels recently.  Pt notes she was prescribed muscle relaxer's, so she is hopeful that it will allow her to rest and move better.    PERTINENT HISTORY:  Pt reports R shoulder pain that began Jan 2024. Reports that she had more pain in Jan, that maybe her pain has gotten a little better since then. Pt reports her motion has not been good and is not improving over the past 2 months. Pain is anterior shoulder that feels sharp and radiates to the ear and chest. Denies numbness/tingling sensation. Current pain 5/10; Best: 3/10 Worst: 8/10. Pt reports increased pain with reaching all directions, especially overhead to do her hair, shower, and put her clothes on. Has pain with lifting, pulling, pushing, and when anything puts pressure on her shoulder. 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 Pt reports she rides a recumbent stationary bike about 3x/week and is able to complete this still, and walks 5x/week. Pt is R handed. Pt denies N/V, B&B changes, unexplained weight fluctuation, saddle paresthesia, fever, night sweats, or unrelenting night pain at this time.   PAIN:  Are you having pain? Yes: NPRS scale: 2-3/10 Pain location: R shoulder Pain description: sharp Aggravating factors: reaching all directions, especially overhead. Has pain with lifting, pulling, pushing, and when anything puts pressure on her shoulder.  Relieving factors:  rest  PRECAUTIONS: None  WEIGHT BEARING RESTRICTIONS: No  FALLS:  Has patient fallen in last 6 months? No  LIVING ENVIRONMENT: Lives with: lives with their family Lives in: House/apartment Stairs: Yes: Internal: 12 steps; none and External: 3 steps; can reach both Has following equipment at home: None  OCCUPATION: Full time med tech  PLOF: Independent  PATIENT GOALS:Get my movement back in my arm  NEXT MD VISIT:   OBJECTIVE:   DIAGNOSTIC FINDINGS:  Pt reported x-ray at [facility] with Dr. Landry Mellow- unremarkable  PATIENT SURVEYS:  FOTO 53 goal 67  COGNITION: Overall cognitive status: Within functional limits for tasks assessed     SENSATION: WFL  POSTURE: FHRS, increased thoracic kyphosis  UPPER EXTREMITY ROM:   Active ROM Right eval Left eval Right 07/26/22  Shoulder flexion 108/142 WNL 110  Shoulder extension 46 WNL   Shoulder abduction 80/84 WNL 125  Shoulder adduction     Shoulder internal rotation Apleys R PSIS/42 Apleys T10 Apleys PSIS  Shoulder external rotation Apleys R ear/22 Apleys CTJ Apleys occiput  Elbow flexion WNL WNL   Elbow extension WNL WNL   Wrist flexion     Wrist extension     Wrist ulnar deviation     Wrist radial deviation     Wrist pronation     Wrist supination     (Blank rows = not tested) All thoracic mobility WNL - with some tension at end range thoracic ext  UPPER EXTREMITY MMT:  MMT Right eval Left eval  Shoulder flexion 4+ 5  Shoulder extension    Shoulder abduction 4 4+  Shoulder adduction    Shoulder internal rotation 4 4+  Shoulder external rotation 3+ 4+  Middle trapezius 3 4+  Lower trapezius unable 4  Elbow flexion 5 5  Elbow extension 5 5  Wrist flexion    Wrist extension    Wrist ulnar deviation    Wrist radial deviation    Wrist pronation    Wrist supination    Grip  strength (lbs)    (Blank rows = not tested)  SHOULDER SPECIAL TESTS:  All impingement tests positive (Neers, HK, painful arc),  but generalized pain with all movements  PALPATION:  TTP with trigger points to R UT and pec major/minor; concordant pain at deep palpation over ant joint line   TODAY'S TREATMENT: DATE: 07/26/22   TherEx:  Goal assessment performed and noted below:     PATIENT EDUCATION:  Education details: Patient was educated on diagnosis, anatomy and pathology involved, prognosis, role of PT, and was given an HEP, demonstrating exercise with proper form following verbal and tactile cues, and was given a paper hand out to continue exercise at home. Pt was educated on and agreed to plan of care.  Person educated: Patient Education method: Explanation, Demonstration, and Handouts Education comprehension: verbalized understanding, returned demonstration, and verbal cues required  HOME EXERCISE PROGRAM: Access Code: [email or web address]  - Seated Shoulder Flexion AAROM with Pulley Behind  - 1-2 x daily - 7 x weekly - 12-20 reps - Seated Shoulder Abduction AAROM with Pulley Behind  - 1-2 x daily - 7 x weekly - 12-20 reps - Standing Shoulder Internal Rotation AAROM with Pulley  - 1-2 x daily - 7 x weekly - 12-20 reps - Standing Median Nerve Glide  - 1 x daily - 7 x weekly - 3 sets - 10 reps - Standing Ulnar Nerve Glide  - 1 x daily - 7 x weekly - 3 sets - 10 reps  ASSESSMENT:  CLINICAL IMPRESSION:  Pt has made inconsistent goal progress at this time, likely due to pt still being the frozen stage of her therapy.  Pt still limited within the joint and advised that therapy may not be the most appropriate at this time due to the stage pt is in.  Pt advised and educated on adhesive capsulitis and the progress that would be made once the pt enters into the thawing phase.  Pt agreed and will continue to monitor and seek new referral when pt feels ready to attempt therapy again.  Pt given updated HEP at conclusion of the session and advised to continue working through the exercises moving forward.  Pt is discharged at  this time and advised to contact clinic if any needs arise.      OBJECTIVE IMPAIRMENTS: Abnormal gait, decreased activity tolerance, decreased coordination, decreased mobility, decreased ROM, decreased strength, increased fascial restrictions, impaired perceived functional ability, impaired flexibility, impaired UE functional use, improper body mechanics, postural dysfunction, and pain.   ACTIVITY LIMITATIONS: carrying, lifting, transfers, bathing, dressing, reach over head, and hygiene/grooming  PARTICIPATION LIMITATIONS: meal prep, cleaning, laundry, driving, shopping, community activity, and yard work  PERSONAL FACTORS: Age, Fitness, Past/current experiences, Time since onset of injury/illness/exacerbation, and 3+ comorbidities: HLD, DM2, previous AC, obesity  are also affecting patient's functional outcome.   REHAB POTENTIAL: Good  CLINICAL DECISION MAKING: Evolving/moderate complexity  EVALUATION COMPLEXITY: Moderate   GOALS: Goals reviewed with patient? Yes  SHORT TERM GOALS: Target date: 06/17/22  Pt will be independent with HEP in order to improve strength and balance in order to decrease fall risk and improve function at home and work.  Baseline: HEP given  Goal status: INITIAL   LONG TERM GOALS: Target date: 08/04/22  Patient will increase FOTO score to 67 to demonstrate predicted increase in functional mobility to complete ADLs Baseline: 53 07/26/22: 55 Goal status: INITIAL  2.  Pt will demonstrate full shoulder ROM in order to complete basic ADLs Baseline: see eval  Goal status: INITIAL  3.  Pt will decrease worst pain as reported on NPRS by at least 3 points in order to demonstrate clinically significant reduction in pain. Baseline: 8/10 07/26/22: 3/10 Goal status: INITIAL   PLAN:  PT FREQUENCY: 1-2x/week  PT DURATION: 8 weeks  PLANNED INTERVENTIONS: Therapeutic exercises, Therapeutic activity, Neuromuscular re-education, Balance training, Gait  training, Patient/Family education, Self Care, Joint mobilization, Joint manipulation, DME instructions, Dry Needling, Spinal manipulation, Cryotherapy, Moist heat, Traction, Ultrasound, Ionotophoresis 4mg /ml Dexamethasone, Manual therapy, and Re-evaluation  PLAN FOR NEXT SESSION: shoulder mobility   Nolon Bussing, PT, DPT Physical Therapist - [facility]  [facility]  07/26/22, 3:57 PM

## 2022-10-14 ENCOUNTER — Other Ambulatory Visit: Payer: Self-pay | Admitting: Otolaryngology

## 2022-10-14 DIAGNOSIS — H918X3 Other specified hearing loss, bilateral: Secondary | ICD-10-CM

## 2022-11-08 ENCOUNTER — Ambulatory Visit
Admission: RE | Admit: 2022-11-08 | Discharge: 2022-11-08 | Disposition: A | Discharge status: Home / Self Care | Payer: Managed Care, Other (non HMO) | Source: Ambulatory Visit | Attending: Otolaryngology | Admitting: Otolaryngology

## 2022-11-08 DIAGNOSIS — H918X3 Other specified hearing loss, bilateral: Secondary | ICD-10-CM

## 2022-11-08 MED ORDER — GADOPICLENOL 0.5 MMOL/ML IV SOLN
7.0000 mL | Freq: Once | INTRAVENOUS | Status: AC | PRN
  Administered 2022-11-08: 7 mL via INTRAVENOUS

## 2023-07-18 ENCOUNTER — Other Ambulatory Visit: Payer: Self-pay | Admitting: Internal Medicine

## 2023-07-18 DIAGNOSIS — Z1231 Encounter for screening mammogram for malignant neoplasm of breast: Secondary | ICD-10-CM

## 2023-07-27 ENCOUNTER — Ambulatory Visit
Admission: RE | Admit: 2023-07-27 | Discharge: 2023-07-27 | Disposition: A | Discharge status: Home / Self Care | Source: Ambulatory Visit | Attending: Internal Medicine | Admitting: Internal Medicine

## 2023-07-27 DIAGNOSIS — Z1231 Encounter for screening mammogram for malignant neoplasm of breast: Secondary | ICD-10-CM | POA: Diagnosis present
